# Patient Record
Sex: Female | Born: 1975
Health system: Southern US, Community
[De-identification: ages and names within clinical notes are randomized; demographics above are authoritative.]

---

## 2017-12-11 ENCOUNTER — Encounter: Payer: Self-pay | Admitting: Internal Medicine

## 2018-02-10 DIAGNOSIS — M6281 Muscle weakness (generalized): Secondary | ICD-10-CM | POA: Diagnosis not present

## 2018-02-10 DIAGNOSIS — M545 Low back pain: Secondary | ICD-10-CM | POA: Diagnosis not present

## 2018-02-12 DIAGNOSIS — M6281 Muscle weakness (generalized): Secondary | ICD-10-CM | POA: Diagnosis not present

## 2018-02-12 DIAGNOSIS — M545 Low back pain: Secondary | ICD-10-CM | POA: Diagnosis not present

## 2018-02-17 DIAGNOSIS — M545 Low back pain: Secondary | ICD-10-CM | POA: Diagnosis not present

## 2018-02-17 DIAGNOSIS — M6281 Muscle weakness (generalized): Secondary | ICD-10-CM | POA: Diagnosis not present

## 2018-02-25 DIAGNOSIS — M545 Low back pain: Secondary | ICD-10-CM | POA: Diagnosis not present

## 2018-02-25 DIAGNOSIS — M6281 Muscle weakness (generalized): Secondary | ICD-10-CM | POA: Diagnosis not present

## 2018-02-27 DIAGNOSIS — M545 Low back pain: Secondary | ICD-10-CM | POA: Diagnosis not present

## 2018-02-27 DIAGNOSIS — M6281 Muscle weakness (generalized): Secondary | ICD-10-CM | POA: Diagnosis not present

## 2018-09-25 DIAGNOSIS — E559 Vitamin D deficiency, unspecified: Secondary | ICD-10-CM | POA: Diagnosis not present

## 2018-09-25 DIAGNOSIS — D509 Iron deficiency anemia, unspecified: Secondary | ICD-10-CM | POA: Diagnosis not present

## 2018-09-25 DIAGNOSIS — M545 Low back pain: Secondary | ICD-10-CM | POA: Diagnosis not present

## 2018-09-25 DIAGNOSIS — E069 Thyroiditis, unspecified: Secondary | ICD-10-CM | POA: Diagnosis not present

## 2018-11-20 ENCOUNTER — Other Ambulatory Visit (HOSPITAL_COMMUNITY)
Admission: RE | Admit: 2018-11-20 | Discharge: 2018-11-20 | Disposition: A | Discharge status: Home / Self Care | Payer: BC Managed Care – PPO | Source: Ambulatory Visit | Attending: Family Medicine | Admitting: Family Medicine

## 2018-11-20 ENCOUNTER — Other Ambulatory Visit: Payer: Self-pay | Admitting: Family Medicine

## 2018-11-20 DIAGNOSIS — D509 Iron deficiency anemia, unspecified: Secondary | ICD-10-CM | POA: Diagnosis not present

## 2018-11-20 DIAGNOSIS — Z13220 Encounter for screening for lipoid disorders: Secondary | ICD-10-CM | POA: Diagnosis not present

## 2018-11-20 DIAGNOSIS — Z124 Encounter for screening for malignant neoplasm of cervix: Secondary | ICD-10-CM | POA: Insufficient documentation

## 2018-11-20 DIAGNOSIS — Z0000 Encounter for general adult medical examination without abnormal findings: Secondary | ICD-10-CM | POA: Diagnosis not present

## 2018-11-23 DIAGNOSIS — Z1231 Encounter for screening mammogram for malignant neoplasm of breast: Secondary | ICD-10-CM | POA: Diagnosis not present

## 2018-11-26 LAB — CYTOLOGY - PAP
Adequacy: ABSENT
Comment: NEGATIVE
Diagnosis: UNDETERMINED — AB
High risk HPV: POSITIVE — AB

## 2018-12-22 DIAGNOSIS — R87619 Unspecified abnormal cytological findings in specimens from cervix uteri: Secondary | ICD-10-CM | POA: Diagnosis not present

## 2018-12-22 DIAGNOSIS — Z3202 Encounter for pregnancy test, result negative: Secondary | ICD-10-CM | POA: Diagnosis not present

## 2019-01-10 DIAGNOSIS — Z20828 Contact with and (suspected) exposure to other viral communicable diseases: Secondary | ICD-10-CM | POA: Diagnosis not present

## 2019-01-21 DIAGNOSIS — M25551 Pain in right hip: Secondary | ICD-10-CM | POA: Diagnosis not present

## 2019-01-21 DIAGNOSIS — M62838 Other muscle spasm: Secondary | ICD-10-CM | POA: Diagnosis not present

## 2019-02-11 DIAGNOSIS — S7001XD Contusion of right hip, subsequent encounter: Secondary | ICD-10-CM | POA: Diagnosis not present

## 2019-02-11 DIAGNOSIS — G44319 Acute post-traumatic headache, not intractable: Secondary | ICD-10-CM | POA: Diagnosis not present

## 2019-02-11 DIAGNOSIS — S161XXD Strain of muscle, fascia and tendon at neck level, subsequent encounter: Secondary | ICD-10-CM | POA: Diagnosis not present

## 2019-02-11 DIAGNOSIS — S7011XD Contusion of right thigh, subsequent encounter: Secondary | ICD-10-CM | POA: Diagnosis not present

## 2019-02-12 ENCOUNTER — Other Ambulatory Visit: Payer: Self-pay | Admitting: Family Medicine

## 2019-02-12 ENCOUNTER — Ambulatory Visit
Admission: RE | Admit: 2019-02-12 | Discharge: 2019-02-12 | Disposition: A | Discharge status: Home / Self Care | Payer: BC Managed Care – PPO | Source: Ambulatory Visit | Attending: Family Medicine | Admitting: Family Medicine

## 2019-02-12 ENCOUNTER — Other Ambulatory Visit: Payer: Self-pay

## 2019-02-12 DIAGNOSIS — S7011XD Contusion of right thigh, subsequent encounter: Secondary | ICD-10-CM

## 2019-02-12 DIAGNOSIS — M25551 Pain in right hip: Secondary | ICD-10-CM | POA: Diagnosis not present

## 2019-02-12 DIAGNOSIS — S79911A Unspecified injury of right hip, initial encounter: Secondary | ICD-10-CM | POA: Diagnosis not present

## 2019-02-12 DIAGNOSIS — S7001XD Contusion of right hip, subsequent encounter: Secondary | ICD-10-CM

## 2019-02-12 DIAGNOSIS — S79921A Unspecified injury of right thigh, initial encounter: Secondary | ICD-10-CM | POA: Diagnosis not present

## 2019-02-25 DIAGNOSIS — Z20828 Contact with and (suspected) exposure to other viral communicable diseases: Secondary | ICD-10-CM | POA: Diagnosis not present

## 2019-04-03 DIAGNOSIS — Z20828 Contact with and (suspected) exposure to other viral communicable diseases: Secondary | ICD-10-CM | POA: Diagnosis not present

## 2019-05-21 DIAGNOSIS — N760 Acute vaginitis: Secondary | ICD-10-CM | POA: Diagnosis not present

## 2019-05-21 DIAGNOSIS — M542 Cervicalgia: Secondary | ICD-10-CM | POA: Diagnosis not present

## 2019-05-21 DIAGNOSIS — M5441 Lumbago with sciatica, right side: Secondary | ICD-10-CM | POA: Diagnosis not present

## 2019-06-03 DIAGNOSIS — M545 Low back pain: Secondary | ICD-10-CM | POA: Diagnosis not present

## 2019-06-03 DIAGNOSIS — M542 Cervicalgia: Secondary | ICD-10-CM | POA: Diagnosis not present

## 2019-06-16 DIAGNOSIS — M545 Low back pain: Secondary | ICD-10-CM | POA: Diagnosis not present

## 2019-06-16 DIAGNOSIS — M542 Cervicalgia: Secondary | ICD-10-CM | POA: Diagnosis not present

## 2019-07-06 ENCOUNTER — Other Ambulatory Visit: Payer: Self-pay | Admitting: Physician Assistant

## 2019-07-06 ENCOUNTER — Other Ambulatory Visit: Payer: Self-pay

## 2019-07-06 ENCOUNTER — Ambulatory Visit
Admission: RE | Admit: 2019-07-06 | Discharge: 2019-07-06 | Disposition: A | Discharge status: Home / Self Care | Payer: BC Managed Care – PPO | Source: Ambulatory Visit | Attending: Physician Assistant | Admitting: Physician Assistant

## 2019-07-06 DIAGNOSIS — R059 Cough, unspecified: Secondary | ICD-10-CM

## 2019-07-06 DIAGNOSIS — R05 Cough: Secondary | ICD-10-CM | POA: Diagnosis not present

## 2019-08-27 DIAGNOSIS — R768 Other specified abnormal immunological findings in serum: Secondary | ICD-10-CM | POA: Diagnosis not present

## 2019-08-27 DIAGNOSIS — N750 Cyst of Bartholin's gland: Secondary | ICD-10-CM | POA: Diagnosis not present
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
## 2020-12-01 IMAGING — CR DG CHEST 2V
2 series · 2 of 2 positions shown · non-contrast
Comparison: 01/08/2018

CLINICAL DATA: Cough

EXAM:
CHEST - 2 VIEW

[w chest pa]
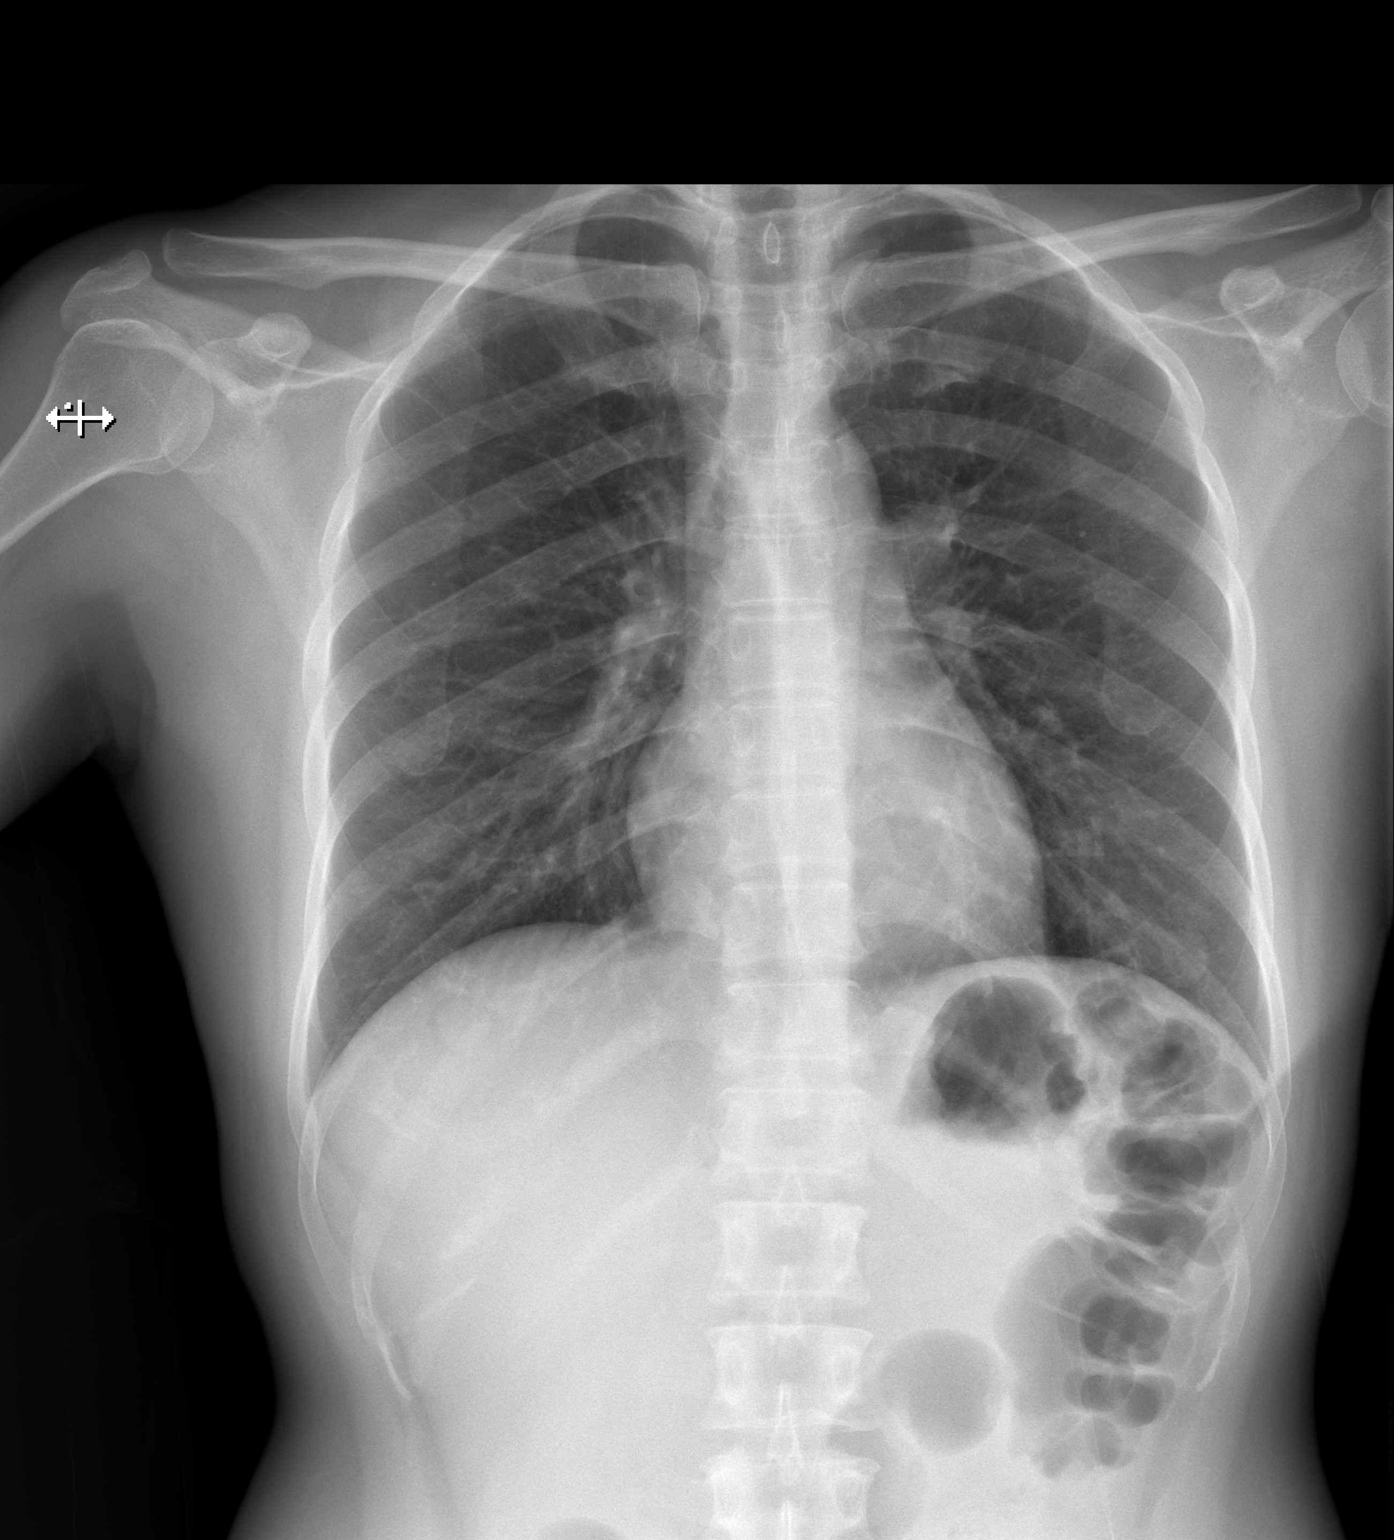

[w chest lat]
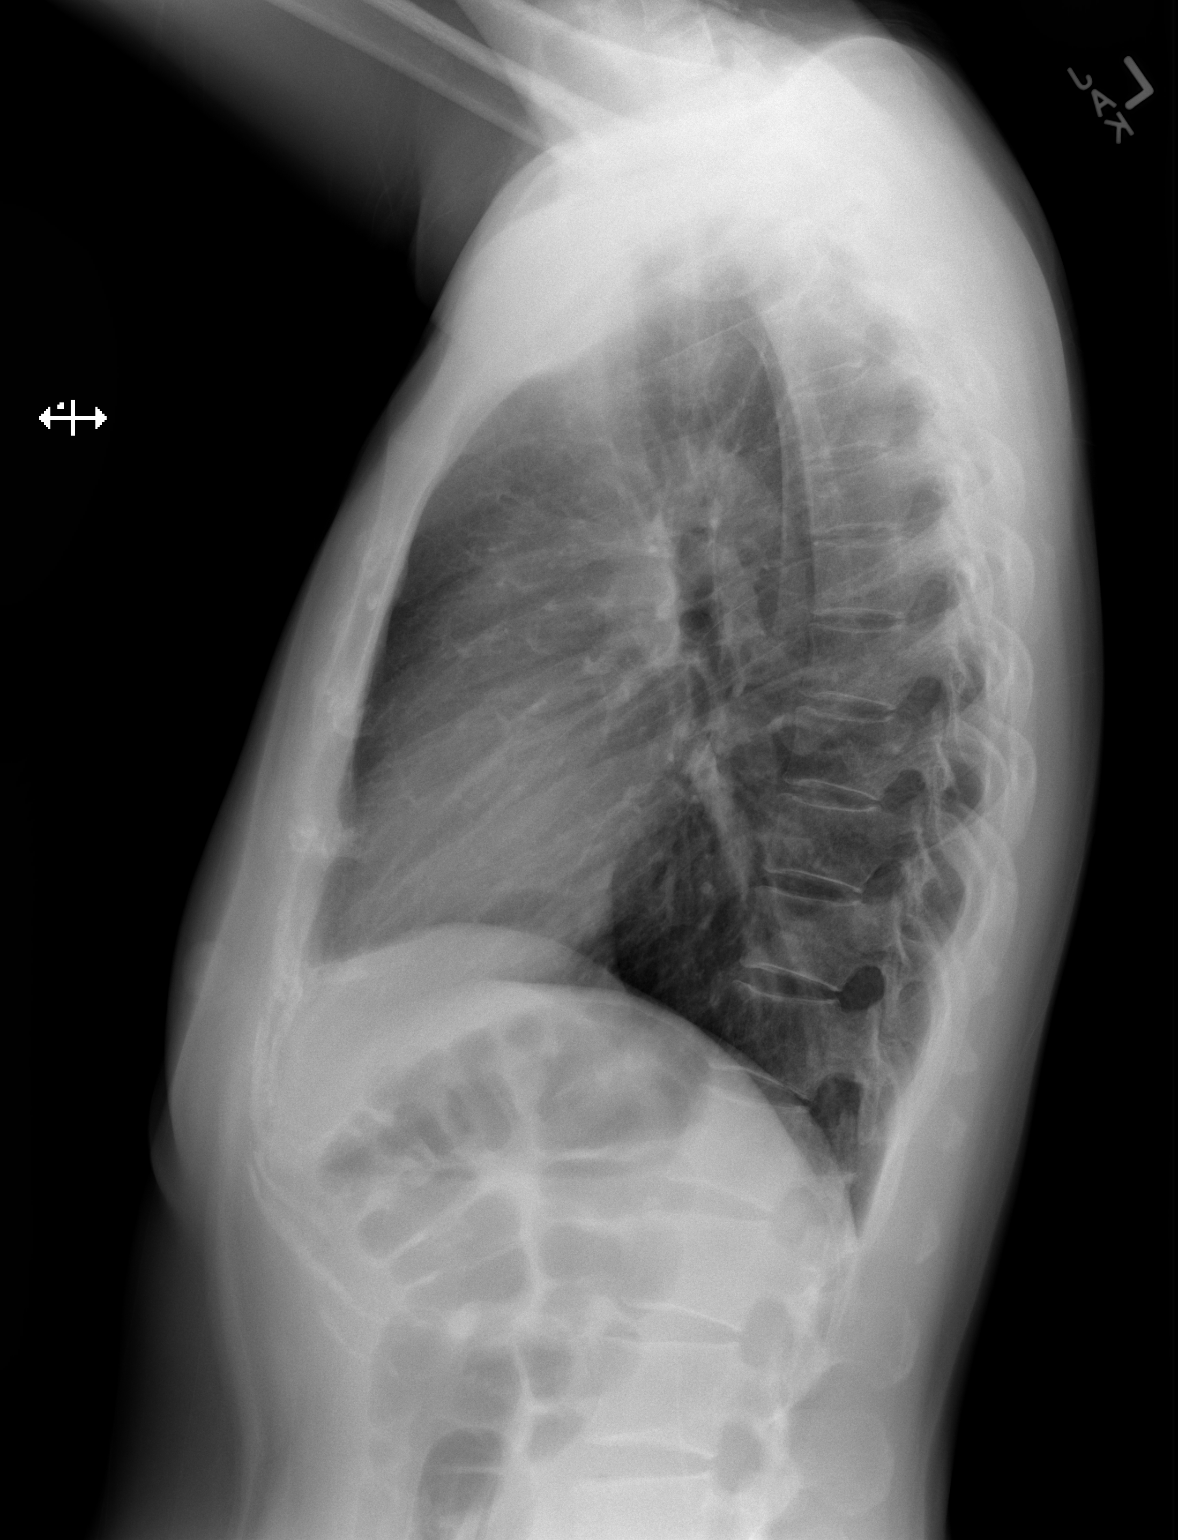

[2 of 2 positions shown; findings below may reference images not displayed]

FINDINGS: The heart size and mediastinal contours are within normal limits.
Both lungs are clear. The visualized skeletal structures are
unremarkable.
IMPRESSION: No acute abnormality of the lungs.

## 2021-08-20 ENCOUNTER — Emergency Department (HOSPITAL_COMMUNITY): Payer: No Typology Code available for payment source

## 2021-08-20 ENCOUNTER — Inpatient Hospital Stay (HOSPITAL_COMMUNITY)
Admission: EM | Admit: 2021-08-20 | Discharge: 2021-08-23 | DRG: 090 | Disposition: A | Discharge status: Home / Self Care | Payer: No Typology Code available for payment source | Attending: General Surgery | Admitting: General Surgery

## 2021-08-20 ENCOUNTER — Other Ambulatory Visit: Payer: Self-pay

## 2021-08-20 ENCOUNTER — Encounter (HOSPITAL_COMMUNITY): Payer: Self-pay | Admitting: General Surgery

## 2021-08-20 DIAGNOSIS — S069XAA Unspecified intracranial injury with loss of consciousness status unknown, initial encounter: Secondary | ICD-10-CM | POA: Diagnosis present

## 2021-08-20 DIAGNOSIS — S0990XA Unspecified injury of head, initial encounter: Principal | ICD-10-CM

## 2021-08-20 DIAGNOSIS — G9340 Encephalopathy, unspecified: Secondary | ICD-10-CM

## 2021-08-20 DIAGNOSIS — S069X0A Unspecified intracranial injury without loss of consciousness, initial encounter: Secondary | ICD-10-CM

## 2021-08-20 DIAGNOSIS — W208XXA Other cause of strike by thrown, projected or falling object, initial encounter: Secondary | ICD-10-CM | POA: Diagnosis present

## 2021-08-20 DIAGNOSIS — S060XAA Concussion with loss of consciousness status unknown, initial encounter: Secondary | ICD-10-CM | POA: Diagnosis present

## 2021-08-20 DIAGNOSIS — Y990 Civilian activity done for income or pay: Secondary | ICD-10-CM

## 2021-08-20 DIAGNOSIS — Z885 Allergy status to narcotic agent status: Secondary | ICD-10-CM

## 2021-08-20 DIAGNOSIS — R112 Nausea with vomiting, unspecified: Secondary | ICD-10-CM | POA: Diagnosis present

## 2021-08-20 DIAGNOSIS — R402423 Glasgow coma scale score 9-12, at hospital admission: Secondary | ICD-10-CM | POA: Diagnosis present

## 2021-08-20 DIAGNOSIS — Z20822 Contact with and (suspected) exposure to covid-19: Secondary | ICD-10-CM | POA: Diagnosis present

## 2021-08-20 DIAGNOSIS — R42 Dizziness and giddiness: Secondary | ICD-10-CM | POA: Diagnosis present

## 2021-08-20 LAB — COMPREHENSIVE METABOLIC PANEL
ALT: 14 U/L (ref 0–44)
AST: 22 U/L (ref 15–41)
Albumin: 3.9 g/dL (ref 3.5–5.0)
Alkaline Phosphatase: 54 U/L (ref 38–126)
Anion gap: 11 (ref 5–15)
BUN: 10 mg/dL (ref 6–20)
CO2: 20 mmol/L — ABNORMAL LOW (ref 22–32)
Calcium: 9 mg/dL (ref 8.9–10.3)
Chloride: 109 mmol/L (ref 98–111)
Creatinine, Ser: 0.76 mg/dL (ref 0.44–1.00)
GFR, Estimated: >60 mL/min (ref >60)
Glucose, Bld: 110 mg/dL — ABNORMAL HIGH (ref 70–99)
Potassium: 3.7 mmol/L (ref 3.5–5.1)
Sodium: 140 mmol/L (ref 135–145)
Total Bilirubin: 0.4 mg/dL (ref 0.3–1.2)
Total Protein: 6.4 g/dL — ABNORMAL LOW (ref 6.5–8.1)

## 2021-08-20 LAB — RESP PANEL BY RT-PCR (FLU A&B, COVID) ARPGX2
Influenza A by PCR: NEGATIVE
Influenza B by PCR: NEGATIVE
SARS Coronavirus 2 by RT PCR: NEGATIVE

## 2021-08-20 LAB — CBG MONITORING, ED: Glucose-Capillary: 113 mg/dL — ABNORMAL HIGH (ref 70–99)

## 2021-08-20 LAB — I-STAT CHEM 8, ED
BUN: 10 mg/dL (ref 6–20)
Calcium, Ion: 1 mmol/L — ABNORMAL LOW (ref 1.15–1.40)
Chloride: 107 mmol/L (ref 98–111)
Creatinine, Ser: 0.6 mg/dL (ref 0.44–1.00)
Glucose, Bld: 111 mg/dL — ABNORMAL HIGH (ref 70–99)
HCT: 40 % (ref 36.0–46.0)
Hemoglobin: 13.6 g/dL (ref 12.0–15.0)
Potassium: 3.4 mmol/L — ABNORMAL LOW (ref 3.5–5.1)
Sodium: 139 mmol/L (ref 135–145)
TCO2: 19 mmol/L — ABNORMAL LOW (ref 22–32)

## 2021-08-20 LAB — CBC
HCT: 42.7 % (ref 36.0–46.0)
Hemoglobin: 13.7 g/dL (ref 12.0–15.0)
MCH: 30.3 pg (ref 26.0–34.0)
MCHC: 32.1 g/dL (ref 30.0–36.0)
MCV: 94.5 fL (ref 80.0–100.0)
Platelets: 311 10*3/uL (ref 150–400)
RBC: 4.52 MIL/uL (ref 3.87–5.11)
RDW: 12.3 % (ref 11.5–15.5)
WBC: 8.6 10*3/uL (ref 4.0–10.5)
nRBC: 0 % (ref 0.0–0.2)

## 2021-08-20 LAB — URINALYSIS, ROUTINE W REFLEX MICROSCOPIC
Bilirubin Urine: NEGATIVE
Glucose, UA: NEGATIVE mg/dL
Hgb urine dipstick: NEGATIVE
Ketones, ur: NEGATIVE mg/dL
Leukocytes,Ua: NEGATIVE
Nitrite: NEGATIVE
Protein, ur: NEGATIVE mg/dL
Specific Gravity, Urine: 1.005 (ref 1.005–1.030)
pH: 7 (ref 5.0–8.0)

## 2021-08-20 LAB — LACTIC ACID, PLASMA: Lactic Acid, Venous: 2.3 mmol/L (ref 0.5–1.9)

## 2021-08-20 LAB — ETHANOL: Alcohol, Ethyl (B): <10 mg/dL (ref <10)

## 2021-08-20 LAB — PROTIME-INR
INR: 0.9 (ref 0.8–1.2)
Prothrombin Time: 12.3 seconds (ref 11.4–15.2)

## 2021-08-20 LAB — SAMPLE TO BLOOD BANK

## 2021-08-20 MED ORDER — PANTOPRAZOLE SODIUM 40 MG PO TBEC
40.0000 mg | DELAYED_RELEASE_TABLET | Freq: Every day | ORAL | Status: DC
  Administered 2021-08-21 – 2021-08-23 (×3): 40 mg via ORAL
  Filled 2021-08-20 (×3): qty 1

## 2021-08-20 MED ORDER — ONDANSETRON 4 MG PO TBDP: 4.0000 mg | ORAL_TABLET | Freq: Four times a day (QID) | ORAL | Status: DC | PRN

## 2021-08-20 MED ORDER — ONDANSETRON HCL 4 MG/2ML IJ SOLN: 4.0000 mg | Freq: Once | INTRAMUSCULAR | Status: AC

## 2021-08-20 MED ORDER — PANTOPRAZOLE SODIUM 40 MG IV SOLR
40.0000 mg | Freq: Every day | INTRAVENOUS | Status: DC
  Administered 2021-08-20: 40 mg via INTRAVENOUS
  Filled 2021-08-20: qty 10

## 2021-08-20 MED ORDER — MORPHINE SULFATE (PF) 4 MG/ML IV SOLN: 4.0000 mg | INTRAVENOUS | Status: DC | PRN

## 2021-08-20 MED ORDER — ENOXAPARIN SODIUM 30 MG/0.3ML IJ SOSY
30.0000 mg | PREFILLED_SYRINGE | Freq: Two times a day (BID) | INTRAMUSCULAR | Status: DC
  Administered 2021-08-21: 30 mg via SUBCUTANEOUS
  Filled 2021-08-20 (×5): qty 0.3

## 2021-08-20 MED ORDER — MORPHINE SULFATE (PF) 4 MG/ML IV SOLN: 4.0000 mg | Freq: Once | INTRAVENOUS | Status: DC

## 2021-08-20 MED ORDER — MORPHINE SULFATE (PF) 2 MG/ML IV SOLN
2.0000 mg | Freq: Once | INTRAVENOUS | Status: AC
  Administered 2021-08-20: 2 mg via INTRAVENOUS
  Filled 2021-08-20: qty 1

## 2021-08-20 MED ORDER — ONDANSETRON HCL 4 MG/2ML IJ SOLN
INTRAMUSCULAR | Status: AC
  Administered 2021-08-20: 4 mg via INTRAVENOUS
  Filled 2021-08-20: qty 2

## 2021-08-20 MED ORDER — MORPHINE SULFATE (PF) 2 MG/ML IV SOLN
2.0000 mg | INTRAVENOUS | Status: DC | PRN
  Administered 2021-08-20: 2 mg via INTRAVENOUS
  Filled 2021-08-20: qty 1

## 2021-08-20 MED ORDER — ONDANSETRON HCL 4 MG/2ML IJ SOLN: 4.0000 mg | Freq: Four times a day (QID) | INTRAMUSCULAR | Status: DC | PRN

## 2021-08-20 MED ORDER — POTASSIUM CHLORIDE IN NACL 20-0.9 MEQ/L-% IV SOLN
INTRAVENOUS | Status: DC
Start: 2021-08-20 — End: 2021-08-22
  Filled 2021-08-20 (×5): qty 1000
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
## 2021-08-20 NOTE — ED Notes (Signed)
Spoke with husband, Philis Kendall  phone number: [phone]  he states he will be here in approximately 10 minutes.

## 2021-08-20 NOTE — ED Notes (Signed)
Trauma Response Nurse Documentation   Melissa Mccarthy is a 46 y.o. female arriving to [facility] ED via EMS  On No antithrombotic. Trauma was activated as a Level 1 by ED Charge RN based on the following trauma criteria GCS < 9. Trauma team at the bedside on patient arrival. Patient cleared for CT by Dr. Janee Morn. Patient to CT with team. GCS 11 on arrival. Was an 8 in the field.  History   History reviewed. No pertinent past medical history.   History reviewed. No pertinent surgical history.    Initial Focused Assessment (If applicable, or please see trauma documentation): - GCS 11 - Unable to speak, but opens eyes spontaneously - PERRLA 4's brisk - BUE purposeful - BLE - unable to move at this time but has sensation - No obvious abrasions or external injuries - C-collar in place - 20G to L AC  CT's Completed:   CT Head and CT C-Spine   Interventions:  - trauma labs - CXR - Pelvic XR - CT head and c-spine - logrolled pt - unremarkable - no stepoffs or obvious deformities - 2mg  morphine given - 4mg  zofran given - BL TPMs WNL - pulses intact all throughout - Gave pen and paper to write - called and updated husband who is now at bedside. - pt nods that she would NOT want a blood transfusion if she needed one.  Noted in chart.  Plan for disposition:  Admission to Progressive Care   Consults completed:  none at 1800.  Event Summary: Pt was at work when she sustained a head injury from a garage door.  Per her coworkers- pt just wasn't "acting right".  They placed her in a WC and called EMS.  Arrived to trauma A in c-collar and nonverbal; seemingly confused.  GCS 11 on arrival.  After CTs were done, pt gesturing appropriately and was able to write.  Her GCS is improving and although still slightly confused, she is able to write and nod appropriately.  Still unable to open her mouth to speak and unable to move her legs.  Trauma MD to admit to progressive unit for obs at this  time.  Bedside handoff with ED RN  Trauma Response RN  Please call TRN at [phone] for further assistance.
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
## 2021-08-20 NOTE — ED Notes (Signed)
Back from CT. Some verbal responses. Speaks [place] Interpreter brought to [facility]. #[id number] Nicole Cella
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
## 2021-08-20 NOTE — ED Notes (Signed)
Patient transported to CT. No changes. Remains awake, eyes open, NAD, calm, resps e/u, non-verbal at this time, no moaning, catatonic-like. Not following commands. Airway patent.

## 2021-08-20 NOTE — ED Notes (Signed)
Portable chest and pelvis in progress

## 2021-08-20 NOTE — ED Notes (Signed)
Used call bell, c/o HA, wants something for head pain. Husband arrives to [facility].

## 2021-08-20 NOTE — Progress Notes (Signed)
Orthopedic Tech Progress Note Patient Details:  Melissa Mccarthy 12-Sep-1975 [id number]  Level 1 trauma   Patient ID: Melissa Mccarthy, female   DOB: [birth year], 46 y.o.   MRN: [id number]  Melissa Mccarthy 08/20/2021, 4:59 PM
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
## 2021-08-20 NOTE — ED Notes (Signed)
Husband works at [employer]: Philis Kendall

## 2021-08-20 NOTE — ED Notes (Signed)
On initial assessment pt c/o lower abdominal pressure like pain with inability to urinate. Lower abdomen was firm. Bladder scan showed greater than 600 mL of urine. Pt was in and out cath with output of 1100 mL of urine. Pt denies hx of urinary retention.

## 2021-08-20 NOTE — ED Notes (Signed)
Gestures, wants to write, doesn't appear to want to talk, again gestures wants to write.

## 2021-08-21 LAB — CBC
HCT: 37.3 % (ref 36.0–46.0)
Hemoglobin: 12.4 g/dL (ref 12.0–15.0)
MCH: 30 pg (ref 26.0–34.0)
MCHC: 33.2 g/dL (ref 30.0–36.0)
MCV: 90.3 fL (ref 80.0–100.0)
Platelets: 309 10*3/uL (ref 150–400)
RBC: 4.13 MIL/uL (ref 3.87–5.11)
RDW: 12.4 % (ref 11.5–15.5)
WBC: 7.1 10*3/uL (ref 4.0–10.5)
nRBC: 0 % (ref 0.0–0.2)

## 2021-08-21 LAB — BASIC METABOLIC PANEL
Anion gap: 8 (ref 5–15)
BUN: 10 mg/dL (ref 6–20)
CO2: 23 mmol/L (ref 22–32)
Calcium: 8 mg/dL — ABNORMAL LOW (ref 8.9–10.3)
Chloride: 109 mmol/L (ref 98–111)
Creatinine, Ser: 0.64 mg/dL (ref 0.44–1.00)
GFR, Estimated: >60 mL/min (ref >60)
Glucose, Bld: 114 mg/dL — ABNORMAL HIGH (ref 70–99)
Potassium: 3.9 mmol/L (ref 3.5–5.1)
Sodium: 140 mmol/L (ref 135–145)

## 2021-08-21 LAB — GLUCOSE, CAPILLARY: Glucose-Capillary: 108 mg/dL — ABNORMAL HIGH (ref 70–99)

## 2021-08-21 LAB — HIV ANTIBODY (ROUTINE TESTING W REFLEX): HIV Screen 4th Generation wRfx: NONREACTIVE

## 2021-08-21 MED ORDER — LIDOCAINE 5 % EX PTCH
1.0000 | MEDICATED_PATCH | CUTANEOUS | Status: DC
  Administered 2021-08-21 – 2021-08-23 (×3): 1 via TRANSDERMAL
  Filled 2021-08-21 (×3): qty 1

## 2021-08-21 MED ORDER — TAMSULOSIN HCL 0.4 MG PO CAPS
0.4000 mg | ORAL_CAPSULE | Freq: Every day | ORAL | Status: DC
  Administered 2021-08-21 – 2021-08-23 (×3): 0.4 mg via ORAL
  Filled 2021-08-21 (×3): qty 1

## 2021-08-21 MED ORDER — PROCHLORPERAZINE MALEATE 5 MG PO TABS: 5.0000 mg | ORAL_TABLET | Freq: Four times a day (QID) | ORAL | Status: DC | PRN

## 2021-08-21 MED ORDER — KETOROLAC TROMETHAMINE 15 MG/ML IJ SOLN
15.0000 mg | Freq: Four times a day (QID) | INTRAMUSCULAR | Status: DC | PRN
  Administered 2021-08-21 – 2021-08-23 (×5): 15 mg via INTRAVENOUS
  Filled 2021-08-21 (×5): qty 1

## 2021-08-21 MED ORDER — METHOCARBAMOL 500 MG PO TABS
500.0000 mg | ORAL_TABLET | Freq: Four times a day (QID) | ORAL | Status: DC | PRN
  Administered 2021-08-22: 500 mg via ORAL
  Filled 2021-08-21: qty 1

## 2021-08-21 MED ORDER — OXYCODONE HCL 5 MG PO TABS
5.0000 mg | ORAL_TABLET | ORAL | Status: DC | PRN
  Administered 2021-08-22: 5 mg via ORAL
  Administered 2021-08-22: 10 mg via ORAL
  Filled 2021-08-21: qty 2
  Filled 2021-08-21: qty 1

## 2021-08-21 NOTE — Evaluation (Signed)
Physical Therapy Evaluation Patient Details Name: Melissa Mccarthy MRN: [id number] DOB: 1975-04-23 Today's Date: 08/21/2021  History of Present Illness  Pt is a 46 y/o female admitted 7/24 after garage door fell and hit her in the head at work. Thought to have TBI/concussion. No pertinent PMH.  Clinical Impression  Pt admitted secondary to problem above with deficits below. Required min A to sit at EOB X2 this session. Pt with increased dizziness and nausea so further mobility deferred. Pt keeping eyes closed, so unable to determine if pt with nystagmus, but would likely benefit from further vestibular testing if pt can tolerate next session. Will need to progress mobility further to determine most appropriate d/c recommendations. Will continue to follow acutely.        Recommendations for follow up therapy are one component of a multi-disciplinary discharge planning process, led by the attending physician.  Recommendations may be updated based on patient status, additional functional criteria and insurance authorization.  Follow Up Recommendations Other (comment) (TBD pending pt progression)      Assistance Recommended at Discharge Frequent or constant Supervision/Assistance  Patient can return home with the following  A lot of help with walking and/or transfers;A lot of help with bathing/dressing/bathroom;Assistance with cooking/housework;Help with stairs or ramp for entrance;Assist for transportation    Equipment Recommendations Other (comment) (TBD pending progression)  Recommendations for Other Services       Functional Status Assessment Patient has had a recent decline in their functional status and demonstrates the ability to make significant improvements in function in a reasonable and predictable amount of time.     Precautions / Restrictions Precautions Precautions: Fall Restrictions Weight Bearing Restrictions: No      Mobility  Bed Mobility Overal bed mobility: Needs  Assistance Bed Mobility: Sidelying to Sit, Sit to Sidelying   Sidelying to sit: Min assist     Sit to sidelying: Min assist General bed mobility comments: Performed sitting X2 and both times pt very dizzy. On second attempt, pt with increased nausea and dry heaving so further mobility deferred. Pt unable to keep eyes open, so not sure if pt had nystagmus.    Transfers                        Ambulation/Gait                  Stairs            Wheelchair Mobility    Modified Rankin (Stroke Patients Only)       Balance Overall balance assessment: Needs assistance Sitting-balance support: Bilateral upper extremity supported, Feet supported Sitting balance-Leahy Scale: Poor Sitting balance - Comments: Reliant on BUE support secondary to dizziness.                                     Pertinent Vitals/Pain      Home Living Family/patient expects to be discharged to:: Private residence Living Arrangements: Spouse/significant other;Children Available Help at Discharge: Family;Available 24 hours/day Type of Home: House Home Access: Stairs to enter Entrance Stairs-Rails: Lawyer of Steps: 2-3   Home Layout: One level Home Equipment: None      Prior Function Prior Level of Function : Independent/Modified Independent;Driving;Working/employed                     Hand Dominance  Extremity/Trunk Assessment   Upper Extremity Assessment Upper Extremity Assessment: Defer to OT evaluation    Lower Extremity Assessment Lower Extremity Assessment: Overall WFL for tasks assessed    Cervical / Trunk Assessment Cervical / Trunk Assessment: Normal  Communication   Communication: Other (comment) (soft spoken)  Cognition Arousal/Alertness:  (keeping eyes closed throughout) Behavior During Therapy: Flat affect Overall Cognitive Status: No family/caregiver present to determine baseline cognitive  functioning                                 General Comments: Somewhat slowed processing but A and O X4        General Comments General comments (skin integrity, edema, etc.): No family present    Exercises     Assessment/Plan    PT Assessment Patient needs continued PT services  PT Problem List Decreased activity tolerance;Decreased balance;Decreased mobility;Decreased coordination;Decreased safety awareness;Decreased cognition       PT Treatment Interventions DME instruction;Gait training;Functional mobility training;Therapeutic activities;Balance training;Therapeutic exercise;Stair training;Patient/family education    PT Goals (Current goals can be found in the Care Plan section)  Acute Rehab PT Goals Patient Stated Goal: to feel better PT Goal Formulation: With patient Time For Goal Achievement: 09/04/21 Potential to Achieve Goals: Good    Frequency Min 3X/week     Co-evaluation               AM-PAC PT "6 Clicks" Mobility  Outcome Measure Help needed turning from your back to your side while in a flat bed without using bedrails?: A Little Help needed moving from lying on your back to sitting on the side of a flat bed without using bedrails?: A Little Help needed moving to and from a bed to a chair (including a wheelchair)?: A Lot Help needed standing up from a chair using your arms (e.g., wheelchair or bedside chair)?: A Lot Help needed to walk in hospital room?: A Lot Help needed climbing 3-5 steps with a railing? : Total 6 Click Score: 13    End of Session   Activity Tolerance: Treatment limited secondary to medical complications (Comment) (dizziness) Patient left: in bed;with call bell/phone within reach;with bed alarm set Nurse Communication: Mobility status;Other (comment) (pt unable to void) PT Visit Diagnosis: Unsteadiness on feet (R26.81);Difficulty in walking, not elsewhere classified (R26.2);Dizziness and giddiness (R42)    Time:  7253-6644 PT Time Calculation (min) (ACUTE ONLY): 18 min   Charges:   PT Evaluation $PT Eval Moderate Complexity: 1 Mod          Farley Ly, PT, DPT  Acute Rehabilitation Services  Office: [phone]   Lehman Prom 08/21/2021, 10:08 AM

## 2021-08-21 NOTE — Evaluation (Signed)
Occupational Therapy Evaluation Patient Details Name: Melissa Mccarthy MRN: [id number] DOB: Oct 22, 1975 Today's Date: 08/21/2021   History of Present Illness Pt is a 46 y/o female admitted 7/24 after garage door fell and hit her in the head at work. Thought to have TBI/concussion. No pertinent PMH.   Clinical Impression   PT admitted with TBI with vestibular deficits noted. Pt currently with functional limitiations due to the deficits listed below (see OT problem list). Pt requiring in and out cath so attempted [facility] transfer without success. Pt noted to start period so pad and mesh panties provided. Pt unable to demonstrate gaze stabilization and benefits from one step commands with repetition. Recommend two person (A) for any transfer attempts as pt is impulsive and pulling on staff for support.  Pt will benefit from skilled OT to increase their independence and safety with adls and balance to allow discharge outpatient OT likely needed pending pt progress.       Recommendations for follow up therapy are one component of a multi-disciplinary discharge planning process, led by the attending physician.  Recommendations may be updated based on patient status, additional functional criteria and insurance authorization.   Follow Up Recommendations  Outpatient OT    Assistance Recommended at Discharge Set up Supervision/Assistance  Patient can return home with the following A little help with walking and/or transfers;A little help with bathing/dressing/bathroom;Assistance with cooking/housework;Assistance with feeding;Direct supervision/assist for medications management;Direct supervision/assist for financial management;Assist for transportation    Functional Status Assessment  Patient has had a recent decline in their functional status and demonstrates the ability to make significant improvements in function in a reasonable and predictable amount of time.  Equipment Recommendations  BSC/3in1;Other  (comment) (possible RW needs)    Recommendations for Other Services       Precautions / Restrictions Precautions Precautions: Fall Restrictions Weight Bearing Restrictions: No      Mobility Bed Mobility Overal bed mobility: Needs Assistance Bed Mobility: Sidelying to Sit, Sit to Sidelying   Sidelying to sit: Min assist     Sit to sidelying: Min assist General bed mobility comments: Pt unable to keep eyes open, so not sure if pt had nystagmus. Pt with gaze stabilization attempted. pt with slide to sit from the L side    Transfers Overall transfer level: Needs assistance                 General transfer comment: BSC t/f      Balance Overall balance assessment: Needs assistance Sitting-balance support: Bilateral upper extremity supported, Feet supported Sitting balance-Leahy Scale: Fair Sitting balance - Comments: Reliant on BUE support secondary to dizziness.                                   ADL either performed or assessed with clinical judgement   ADL Overall ADL's : Needs assistance/impaired                         Toilet Transfer: Moderate assistance;BSC/3in1;Stand-pivot Toilet Transfer Details (indicate cue type and reason): needed max cues to not pull on therapist or therapist neck Toileting- Clothing Manipulation and Hygiene: Moderate assistance Toileting - Clothing Manipulation Details (indicate cue type and reason): provided pad and mesh panties due to period       General ADL Comments: progressed from bed to [facility] no void of any kind and back to bed  Vision   Additional Comments: pt keeping eyes closed. pt does not follow commands to sustain gaze stabilization. pt with one step command following with repetitive command repeat.     Perception     Praxis      Pertinent Vitals/Pain Pain Assessment Pain Assessment: Faces Faces Pain Scale: Hurts little more Pain Location: ha Pain Descriptors / Indicators:  Headache Pain Intervention(s): Limited activity within patient's tolerance, Monitored during session, Premedicated before session, Repositioned     Hand Dominance Right   Extremity/Trunk Assessment Upper Extremity Assessment Upper Extremity Assessment: Overall WFL for tasks assessed   Lower Extremity Assessment Lower Extremity Assessment: Defer to PT evaluation   Cervical / Trunk Assessment Cervical / Trunk Assessment: Normal   Communication Communication Communication: Other (comment) (soft spoken)   Cognition Arousal/Alertness: Awake/alert (keeping eyes closed throughout) Behavior During Therapy: Flat affect Overall Cognitive Status: Impaired/Different from baseline                                 General Comments: Somewhat slowed processing but A and O X4, pt required increased time of > 30 seconds to name home address, same to name youngest child. pt was unable to recall spouse truck color until given color options ( orange so its very distintive color per spouse)     General Comments  spouse present throughout    Exercises     Shoulder Instructions      Home Living Family/patient expects to be discharged to:: Private residence Living Arrangements: Spouse/significant other;Children Available Help at Discharge: Family;Available 24 hours/day Type of Home: House Home Access: Stairs to enter [employer] of Steps: 2-3 Entrance Stairs-Rails: Left;Right Home Layout: One level     Bathroom Shower/Tub: Chief Strategy Officer: Standard     Home Equipment: None   Additional Comments: spouse and kids      Prior Functioning/Environment Prior Level of Function : Independent/Modified Independent;Driving;Working/employed                        OT Problem List: Decreased activity tolerance;Impaired balance (sitting and/or standing);Impaired vision/perception;Decreased coordination;Decreased cognition;Decreased safety  awareness;Decreased knowledge of use of DME or AE;Decreased knowledge of precautions      OT Treatment/Interventions: Self-care/ADL training;Therapeutic exercise;Neuromuscular education;DME and/or AE instruction;Energy conservation;Manual therapy;Modalities;Therapeutic activities;Cognitive remediation/compensation;Visual/perceptual remediation/compensation;Patient/family education;Balance training    OT Goals(Current goals can be found in the care plan section) Acute Rehab OT Goals Patient Stated Goal: none stated by patient OT Goal Formulation: With patient/family Time For Goal Achievement: 09/04/21 Potential to Achieve Goals: Good  OT Frequency: Min 3X/week    Co-evaluation              AM-PAC OT "6 Clicks" Daily Activity     Outcome Measure Help from another person eating meals?: A Lot Help from another person taking care of personal grooming?: A Lot Help from another person toileting, which includes using toliet, bedpan, or urinal?: A Lot Help from another person bathing (including washing, rinsing, drying)?: A Lot Help from another person to put on and taking off regular upper body clothing?: A Lot Help from another person to put on and taking off regular lower body clothing?: A Lot 6 Click Score: 12   End of Session Equipment Utilized During Treatment: Gait belt Nurse Communication: Mobility status;Precautions  Activity Tolerance: Patient tolerated treatment well Patient left: in bed;with call bell/phone within reach;with bed alarm set;with family/visitor present  OT Visit Diagnosis: Unsteadiness on feet (R26.81)                Time: 1410-1430 OT Time Calculation (min): 20 min Charges:  OT General Charges $OT Visit: 1 Visit OT Evaluation $OT Eval Moderate Complexity: 1 Mod   Brynn, OTR/L  Acute Rehabilitation Services Office: [phone] .   Mateo Flow 08/21/2021, 2:40 PM

## 2021-08-21 NOTE — ED Notes (Signed)
Pt continues to retain urine as when first arriving to this shift. Bladder scan ultrasound showing 585 mL. Pt currently on purwick. Pt encouraged to urinate. Pt states she will try.

## 2021-08-21 NOTE — TOC Initial Note (Signed)
Transition of Care [facility]) - Initial/Assessment Note    Patient Details  Name: Melissa Mccarthy MRN: [id number] Date of Birth: [birth year]  Transition of Care [facility]) CM/SW Contact:    Glennon Mac, RN Phone Number: 08/21/2021, 3:56 PM  Clinical Narrative:                 Pt is a 46 y/o female admitted 7/24 after garage door fell and hit her in the head at work. Thought to have TBI/concussion. Prior to admission, patient independent and living at home with husband and children.  Her husband is at bedside; he verifies that case is Worker's Comp., and gives permission for Trauma Case Manager to speak with WC adjustor on her behalf.  Patient sleepy; anticipate continued progress with therapies and need for outpatient follow-up.  Will continue to follow as patient progresses.  Spoke with Scientist, water quality, [place] Mitchell at [employer]; phone: [phone].  He states that no adjustor has been assigned to patient's case at this time, but will call me back with adjustor information when available.  Expected Discharge Plan: OP Rehab Barriers to Discharge: Continued Medical Work up          Expected Discharge Plan and Services Expected Discharge Plan: OP Rehab   Discharge Planning Services: CM Consult   Living arrangements for the past 2 months: Single Family Home                                      Prior Living Arrangements/Services Living arrangements for the past 2 months: Single Family Home Lives with:: Spouse Patient language and need for interpreter reviewed:: Yes Do you feel safe going back to the place where you live?: Yes      Need for Family Participation in Patient Care: Yes (Comment) Care giver support system in place?: Yes (comment)   Criminal Activity/Legal Involvement Pertinent to Current Situation/Hospitalization: No - Comment as needed  Activities of Daily Living      Permission Sought/Granted   Permission granted to share information with : Yes,  Verbal Permission Granted  Share Information with NAME: [place] Mitchell  Permission granted to share info w AGENCY: [employer]  Permission granted to share info w Relationship: Scientist, water quality  Permission granted to share info w Contact Information: [phone]  Emotional Assessment Appearance:: Appears stated age Attitude/Demeanor/Rapport: Lethargic Affect (typically observed): Appropriate Orientation: : Oriented to Self, Oriented to Place, Oriented to  Time, Oriented to Situation      Admission diagnosis:  TBI (traumatic brain injury) (HCC) [S06.9XAA] Acute encephalopathy [G93.40] Injury of head, initial encounter [S09.90XA] Patient Active Problem List   Diagnosis Date Noted   TBI (traumatic brain injury) (HCC) 08/20/2021   PCP:  Aviva Kluver Pharmacy:   [facility] [id number] [facility] [place] - [address] [facility] [place] [address] Phone: [phone] Fax: [phone]     Social Determinants of Health (SDOH) Interventions    Readmission Risk Interventions     No data to display         Quintella Baton, RN, BSN  Trauma/Neuro ICU Case Manager [phone]

## 2021-08-21 NOTE — Evaluation (Signed)
Speech Language Pathology Evaluation Patient Details Name: Melissa Mccarthy MRN: [id number] DOB: 05/30/1975 Today's Date: 08/21/2021 Time: 1062-6948 SLP Time Calculation (min) (ACUTE ONLY): 9 min  Problem List:  Patient Active Problem List   Diagnosis Date Noted   TBI (traumatic brain injury) (HCC) 08/20/2021   Past Medical History: History reviewed. No pertinent past medical history. Past Surgical History: History reviewed. No pertinent surgical history. HPI:  Pt is a 46 y/o female admitted 7/24 after garage door fell and hit her in the head at work. Thought to have TBI/concussion. No pertinent PMH.   Assessment / Plan / Recommendation Clinical Impression  Pt participated in limited initial cognitive-linguistic assessment - she was very dizzy, lying flat in bed and kept eyes closed, stating she did not want to open them.  She understood and communicated in English quite well. She followed commands, selectively attended to verbal stimuli, was oriented to time/place/surroundings. Response time - verbal and motor- was delayed by as great as 15 seconds. Speech output was clear and fluent. Assessment was discontinued due to Melissa Mccarthy's obvious discomfort.  She will need further cognitive evaluation as her ability to participate improves.    SLP Assessment  SLP Recommendation/Assessment: Patient needs continued Speech Lanaguage Pathology Services SLP Visit Diagnosis: Cognitive communication deficit (R41.841)    Recommendations for follow up therapy are one component of a multi-disciplinary discharge planning process, led by the attending physician.  Recommendations may be updated based on patient status, additional functional criteria and insurance authorization.    Follow Up Recommendations   (tba)    Assistance Recommended at Discharge  Frequent or constant Supervision/Assistance  Functional Status Assessment Patient has had a recent decline in their functional status and demonstrates the  ability to make significant improvements in function in a reasonable and predictable amount of time.  Frequency and Duration min 2x/week  1 week      SLP Evaluation Cognition  Overall Cognitive Status: Within Functional Limits for tasks assessed Arousal/Alertness: Awake/alert Orientation Level: Oriented to person;Oriented to place;Oriented to situation Attention: Sustained Sustained Attention: Appears intact Memory:  (tba) Awareness: Appears intact       Comprehension  Auditory Comprehension Overall Auditory Comprehension: Appears within functional limits for tasks assessed Yes/No Questions: Within Functional Limits Commands: Within Functional Limits    Expression Expression Primary Mode of Expression: Verbal Verbal Expression Overall Verbal Expression: Appears within functional limits for tasks assessed   Oral / Motor  Oral Motor/Sensory Function Overall Oral Motor/Sensory Function: Within functional limits Motor Speech Overall Motor Speech: Appears within functional limits for tasks assessed           [name] L. Samson Frederic, MA CCC/SLP Clinical Specialist - Acute Care SLP Acute Rehabilitation Services Office number [phone]  [name] [name] 08/21/2021, 11:10 AM

## 2021-08-21 NOTE — Plan of Care (Signed)
  Problem: Education: Goal: Knowledge of General Education information will improve Description Including pain rating scale, medication(s)/side effects and non-pharmacologic comfort measures Outcome: Progressing   Problem: Health Behavior/Discharge Planning: Goal: Ability to manage health-related needs will improve Outcome: Progressing   

## 2021-08-21 NOTE — Progress Notes (Addendum)
[facility] Progress Note     Subjective: CC:  Eyes closed but appropriately answers questions. Cc headache, R sided neck pain, and inability to void. Her husband Is at the bedside.  Objective: Vital signs in last 24 hours: Temp:  [97.2 F (36.2 C)-97.3 F (36.3 C)] 97.3 F (36.3 C) (07/25 0611) Pulse Rate:  [66-91] 68 (07/25 0700) Resp:  [8-21] 15 (07/25 0700) BP: (90-123)/(47-78) 92/50 (07/25 0700) SpO2:  [97 %-100 %] 98 % (07/25 0700) Weight:  [52.2 kg] 52.2 kg (07/24 1731)    Intake/Output from previous day: 07/24 0701 - 07/25 0700 In: -  Out: 1100 [Urine:1100] Intake/Output this shift: No intake/output data recorded.  PE: Gen:  resting comfortably, NAD, pleasant HENT: normocephalic, atraumatic, no appreciable scalp hematoma or laceration, PERRL, anicteric sclerae Neck: collar removed - there is no midline tenderness, there is TTP over the R trapezius muscle, pt able to perform neck rotation and flexion/extension- slightly limited by headache and R trapezius pain.  Card:  Regular rate and rhythm, pedal pulses 2+ BL Pulm:  Normal effort, clear to auscultation bilaterally Abd: Soft, non-tender, non-distended, bowel sounds present in all 4 quadrants, no HSM Skin: warm and dry, no rashes  Psych: A&Ox4 - person, hospital, 2023, and hit by garage door at her company  Neuro: non-focal exam, moving all extremities   Lab Results:  Recent Labs    08/20/21 1706 08/20/21 1718 08/21/21 0341  WBC 8.6  --  7.1  HGB 13.7 13.6 12.4  HCT 42.7 40.0 37.3  PLT 311  --  309   BMET Recent Labs    08/20/21 1706 08/20/21 1718 08/21/21 0341  NA 140 139 140  K 3.7 3.4* 3.9  CL 109 107 109  CO2 20*  --  23  GLUCOSE 110* 111* 114*  BUN 10 10 10   CREATININE 0.76 0.60 0.64  CALCIUM 9.0  --  8.0*   PT/INR Recent Labs    08/20/21 1706  LABPROT 12.3  INR 0.9   CMP     Component Value Date/Time   NA 140 08/21/2021 0341   K 3.9 08/21/2021 0341   CL 109  08/21/2021 0341   CO2 23 08/21/2021 0341   GLUCOSE 114 (H) 08/21/2021 0341   BUN 10 08/21/2021 0341   CREATININE 0.64 08/21/2021 0341   CALCIUM 8.0 (L) 08/21/2021 0341   PROT 6.4 (L) 08/20/2021 1706   ALBUMIN 3.9 08/20/2021 1706   AST 22 08/20/2021 1706   ALT 14 08/20/2021 1706   ALKPHOS 54 08/20/2021 1706   BILITOT 0.4 08/20/2021 1706   GFRNONAA >60 08/21/2021 0341   Lipase  No results found for: "LIPASE"     Studies/Results: CT HEAD WO CONTRAST  Result Date: 08/20/2021 CLINICAL DATA:  Hit in head by garage door.  Unresponsive. EXAM: CT HEAD WITHOUT CONTRAST CT CERVICAL SPINE WITHOUT CONTRAST TECHNIQUE: Multidetector CT imaging of the head and cervical spine was performed following the standard protocol without intravenous contrast. Multiplanar CT image reconstructions of the cervical spine were also generated. RADIATION DOSE REDUCTION: This exam was performed according to the departmental dose-optimization program which includes automated exposure control, adjustment of the mA and/or kV according to patient size and/or use of iterative reconstruction technique. COMPARISON:  Cervical spine plain films of 01/22/2018. FINDINGS: CT HEAD FINDINGS Brain: Suspect scattered mild cerebral atrophy for age. Physiologic calcifications within the basal ganglia. No mass lesion, hemorrhage, hydrocephalus, acute infarct, intra-axial, or extra-axial fluid collection. Vascular: No hyperdense vessel or unexpected calcification.  Skull: No significant soft tissue swelling.  No skull fracture. Sinuses/Orbits: Normal imaged portions of the orbits and globes. Hypoplastic frontal sinuses. Other paranasal sinuses and paranasal sinuses are clear. Other: None. CT CERVICAL SPINE FINDINGS Alignment: Spinal visualization through the bottom of T2. Maintenance of vertebral body height and alignment. Skull base and vertebrae: Skull base intact. Coronal reformats demonstrate a normal C1-C2 articulation. Facets are  well-aligned. Soft tissues and spinal canal: Prevertebral soft tissues are within normal limits. Disc levels: Maintenance of intervertebral disc height. Mild left-sided neural foraminal narrowing at C5-6 secondary to facet hypertrophy. Upper chest: No apical pneumothorax. Other: None. IMPRESSION: 1.  No acute intracranial abnormality. 2. No acute fracture or subluxation within the cervical spine. Case discussed with Dr. Janee Morn at 5:18 p.m. Electronically Signed   By: Jeronimo Greaves M.D.   On: 08/20/2021 17:30   CT CERVICAL SPINE WO CONTRAST  Result Date: 08/20/2021 CLINICAL DATA:  Hit in head by garage door.  Unresponsive. EXAM: CT HEAD WITHOUT CONTRAST CT CERVICAL SPINE WITHOUT CONTRAST TECHNIQUE: Multidetector CT imaging of the head and cervical spine was performed following the standard protocol without intravenous contrast. Multiplanar CT image reconstructions of the cervical spine were also generated. RADIATION DOSE REDUCTION: This exam was performed according to the departmental dose-optimization program which includes automated exposure control, adjustment of the mA and/or kV according to patient size and/or use of iterative reconstruction technique. COMPARISON:  Cervical spine plain films of 01/22/2018. FINDINGS: CT HEAD FINDINGS Brain: Suspect scattered mild cerebral atrophy for age. Physiologic calcifications within the basal ganglia. No mass lesion, hemorrhage, hydrocephalus, acute infarct, intra-axial, or extra-axial fluid collection. Vascular: No hyperdense vessel or unexpected calcification. Skull: No significant soft tissue swelling.  No skull fracture. Sinuses/Orbits: Normal imaged portions of the orbits and globes. Hypoplastic frontal sinuses. Other paranasal sinuses and paranasal sinuses are clear. Other: None. CT CERVICAL SPINE FINDINGS Alignment: Spinal visualization through the bottom of T2. Maintenance of vertebral body height and alignment. Skull base and vertebrae: Skull base intact.  Coronal reformats demonstrate a normal C1-C2 articulation. Facets are well-aligned. Soft tissues and spinal canal: Prevertebral soft tissues are within normal limits. Disc levels: Maintenance of intervertebral disc height. Mild left-sided neural foraminal narrowing at C5-6 secondary to facet hypertrophy. Upper chest: No apical pneumothorax. Other: None. IMPRESSION: 1.  No acute intracranial abnormality. 2. No acute fracture or subluxation within the cervical spine. Case discussed with Dr. Janee Morn at 5:18 p.m. Electronically Signed   By: Jeronimo Greaves M.D.   On: 08/20/2021 17:30   DG Chest Port 1 View  Result Date: 08/20/2021 CLINICAL DATA:  Trauma EXAM: PORTABLE CHEST 1 VIEW COMPARISON:  July 06, 2019 FINDINGS: The heart size and mediastinal contours are within normal limits. Low lung volume. There are some streaky opacities seen at the perihilar regions greater greater on the left. No consolidation, hemo or pneumothorax. The visualized skeletal structures are unremarkable. IMPRESSION: Perihilar streaky opacities greater on the left likely atelectasis. No hemo or pneumothorax. No pulmonary contusion. Electronically Signed   By: Marjo Bicker M.D.   On: 08/20/2021 17:22   DG Pelvis Portable  Result Date: 08/20/2021 CLINICAL DATA:  Fall EXAM: PORTABLE PELVIS 1-2 VIEWS COMPARISON:  None Available. FINDINGS: There is no evidence of pelvic fracture or diastasis. No pelvic bone lesions are seen. IMPRESSION: Negative. Electronically Signed   By: Jasmine Pang M.D.   On: 08/20/2021 17:18    Anti-infectives: Anti-infectives (From admission, onward)    None  Assessment/Plan Struck in head by garage door TBI/significant concussion - CT H neg, CT C spine negative, collar cleared  - has HA and pain over R trapezius muscle. Start IV toradol PRN, PO robaxin PRN, lidoderm patch. Has listed anaphylactic reaction to codeine/tylenol.  FEN: mental status improved - advance diet to regular ID: None  indicated VTE: SCD's, lovenox  Foley: none currently, has had I&O cath once, RN to bladder scan patient and perform I&O cath a second time if >350 mL. Start flomax. After that, if unable to void, place to place foley Dispo: med surg, advance diet, PT/OT/SLP cog eval    ADDENDUM 1345 - noted that PT/SLP limited by dizziness, nausea, discomfort. Added compazine. Appreciate therapies following acutely.   LOS: 1 day   I reviewed nursing notes, ED provider notes, last 24 h vitals and pain scores, last 48 h intake and output, last 24 h labs and trends, and last 24 h imaging results.   Hosie Spangle, PA-C [facility] Please see Amion for pager number during day hours 7:00am-4:30pm

## 2021-08-21 NOTE — ED Notes (Addendum)
Paged trauma admitting concerning pain management. BP soft throughout night. Last BP 99/52. Pt requesting pain medication. Unable to given morphine d/t low BP. Paged to request alternative pain medication and ongoing urinary retention. Awaiting call back.

## 2021-08-22 MED ORDER — PROCHLORPERAZINE MALEATE 5 MG PO TABS
5.0000 mg | ORAL_TABLET | Freq: Four times a day (QID) | ORAL | Status: DC
  Administered 2021-08-22 – 2021-08-23 (×2): 5 mg via ORAL
  Filled 2021-08-22 (×6): qty 1

## 2021-08-22 NOTE — Progress Notes (Signed)
[facility] Progress Note     Subjective: CC:  Eyes closed but appropriately answers questions. Cc headache, R sided neck pain, and inability to void. Her husband Is at the bedside.  Objective: Vital signs in last 24 hours: Temp:  [98.1 F (36.7 C)-98.7 F (37.1 C)] 98.1 F (36.7 C) (07/26 0756) Pulse Rate:  [66-79] 70 (07/26 0756) Resp:  [16-18] 18 (07/26 0756) BP: (92-101)/(55-60) 96/58 (07/26 0756) SpO2:  [98 %-100 %] 100 % (07/26 0756) Last BM Date : 08/20/21  Intake/Output from previous day: 07/25 0701 - 07/26 0700 In: 120 [P.O.:120] Out: 1000 [Urine:1000] Intake/Output this shift: Total I/O In: 240 [P.O.:240] Out: -   PE: Gen:  resting comfortably, NAD, pleasant HENT: normocephalic, atraumatic, no appreciable scalp hematoma or laceration, PERRL, anicteric sclerae Neck: collar removed - there is no midline tenderness, there is TTP over the R trapezius muscle, pt able to perform neck rotation and flexion/extension- slightly limited by headache and R trapezius pain.  Card:  Regular rate and rhythm, pedal pulses 2+ BL Pulm:  Normal effort, clear to auscultation bilaterally Abd: Soft, non-tender, non-distended, bowel sounds present in all 4 quadrants, no HSM Skin: warm and dry, no rashes  Psych: A&Ox4 - person, hospital, 2023, and hit by garage door at her company  Neuro: non-focal exam, moving all extremities   Lab Results:  Recent Labs    08/20/21 1706 08/20/21 1718 08/21/21 0341  WBC 8.6  --  7.1  HGB 13.7 13.6 12.4  HCT 42.7 40.0 37.3  PLT 311  --  309   BMET Recent Labs    08/20/21 1706 08/20/21 1718 08/21/21 0341  NA 140 139 140  K 3.7 3.4* 3.9  CL 109 107 109  CO2 20*  --  23  GLUCOSE 110* 111* 114*  BUN 10 10 10   CREATININE 0.76 0.60 0.64  CALCIUM 9.0  --  8.0*   PT/INR Recent Labs    08/20/21 1706  LABPROT 12.3  INR 0.9   CMP     Component Value Date/Time   NA 140 08/21/2021 0341   K 3.9 08/21/2021 0341   CL 109  08/21/2021 0341   CO2 23 08/21/2021 0341   GLUCOSE 114 (H) 08/21/2021 0341   BUN 10 08/21/2021 0341   CREATININE 0.64 08/21/2021 0341   CALCIUM 8.0 (L) 08/21/2021 0341   PROT 6.4 (L) 08/20/2021 1706   ALBUMIN 3.9 08/20/2021 1706   AST 22 08/20/2021 1706   ALT 14 08/20/2021 1706   ALKPHOS 54 08/20/2021 1706   BILITOT 0.4 08/20/2021 1706   GFRNONAA >60 08/21/2021 0341   Lipase  No results found for: "LIPASE"     Studies/Results: CT HEAD WO CONTRAST  Result Date: 08/20/2021 CLINICAL DATA:  Hit in head by garage door.  Unresponsive. EXAM: CT HEAD WITHOUT CONTRAST CT CERVICAL SPINE WITHOUT CONTRAST TECHNIQUE: Multidetector CT imaging of the head and cervical spine was performed following the standard protocol without intravenous contrast. Multiplanar CT image reconstructions of the cervical spine were also generated. RADIATION DOSE REDUCTION: This exam was performed according to the departmental dose-optimization program which includes automated exposure control, adjustment of the mA and/or kV according to patient size and/or use of iterative reconstruction technique. COMPARISON:  Cervical spine plain films of 01/22/2018. FINDINGS: CT HEAD FINDINGS Brain: Suspect scattered mild cerebral atrophy for age. Physiologic calcifications within the basal ganglia. No mass lesion, hemorrhage, hydrocephalus, acute infarct, intra-axial, or extra-axial fluid collection. Vascular: No hyperdense vessel or unexpected calcification. Skull:  No significant soft tissue swelling.  No skull fracture. Sinuses/Orbits: Normal imaged portions of the orbits and globes. Hypoplastic frontal sinuses. Other paranasal sinuses and paranasal sinuses are clear. Other: None. CT CERVICAL SPINE FINDINGS Alignment: Spinal visualization through the bottom of T2. Maintenance of vertebral body height and alignment. Skull base and vertebrae: Skull base intact. Coronal reformats demonstrate a normal C1-C2 articulation. Facets are  well-aligned. Soft tissues and spinal canal: Prevertebral soft tissues are within normal limits. Disc levels: Maintenance of intervertebral disc height. Mild left-sided neural foraminal narrowing at C5-6 secondary to facet hypertrophy. Upper chest: No apical pneumothorax. Other: None. IMPRESSION: 1.  No acute intracranial abnormality. 2. No acute fracture or subluxation within the cervical spine. Case discussed with Dr. Janee Morn at 5:18 p.m. Electronically Signed   By: Jeronimo Greaves M.D.   On: 08/20/2021 17:30   CT CERVICAL SPINE WO CONTRAST  Result Date: 08/20/2021 CLINICAL DATA:  Hit in head by garage door.  Unresponsive. EXAM: CT HEAD WITHOUT CONTRAST CT CERVICAL SPINE WITHOUT CONTRAST TECHNIQUE: Multidetector CT imaging of the head and cervical spine was performed following the standard protocol without intravenous contrast. Multiplanar CT image reconstructions of the cervical spine were also generated. RADIATION DOSE REDUCTION: This exam was performed according to the departmental dose-optimization program which includes automated exposure control, adjustment of the mA and/or kV according to patient size and/or use of iterative reconstruction technique. COMPARISON:  Cervical spine plain films of 01/22/2018. FINDINGS: CT HEAD FINDINGS Brain: Suspect scattered mild cerebral atrophy for age. Physiologic calcifications within the basal ganglia. No mass lesion, hemorrhage, hydrocephalus, acute infarct, intra-axial, or extra-axial fluid collection. Vascular: No hyperdense vessel or unexpected calcification. Skull: No significant soft tissue swelling.  No skull fracture. Sinuses/Orbits: Normal imaged portions of the orbits and globes. Hypoplastic frontal sinuses. Other paranasal sinuses and paranasal sinuses are clear. Other: None. CT CERVICAL SPINE FINDINGS Alignment: Spinal visualization through the bottom of T2. Maintenance of vertebral body height and alignment. Skull base and vertebrae: Skull base intact.  Coronal reformats demonstrate a normal C1-C2 articulation. Facets are well-aligned. Soft tissues and spinal canal: Prevertebral soft tissues are within normal limits. Disc levels: Maintenance of intervertebral disc height. Mild left-sided neural foraminal narrowing at C5-6 secondary to facet hypertrophy. Upper chest: No apical pneumothorax. Other: None. IMPRESSION: 1.  No acute intracranial abnormality. 2. No acute fracture or subluxation within the cervical spine. Case discussed with Dr. Janee Morn at 5:18 p.m. Electronically Signed   By: Jeronimo Greaves M.D.   On: 08/20/2021 17:30   DG Chest Port 1 View  Result Date: 08/20/2021 CLINICAL DATA:  Trauma EXAM: PORTABLE CHEST 1 VIEW COMPARISON:  July 06, 2019 FINDINGS: The heart size and mediastinal contours are within normal limits. Low lung volume. There are some streaky opacities seen at the perihilar regions greater greater on the left. No consolidation, hemo or pneumothorax. The visualized skeletal structures are unremarkable. IMPRESSION: Perihilar streaky opacities greater on the left likely atelectasis. No hemo or pneumothorax. No pulmonary contusion. Electronically Signed   By: Marjo Bicker M.D.   On: 08/20/2021 17:22   DG Pelvis Portable  Result Date: 08/20/2021 CLINICAL DATA:  Fall EXAM: PORTABLE PELVIS 1-2 VIEWS COMPARISON:  None Available. FINDINGS: There is no evidence of pelvic fracture or diastasis. No pelvic bone lesions are seen. IMPRESSION: Negative. Electronically Signed   By: Jasmine Pang M.D.   On: 08/20/2021 17:18    Anti-infectives: Anti-infectives (From admission, onward)    None        Assessment/Plan  Struck in head by garage door TBI/significant concussion - CT H neg, CT C spine negative, collar cleared  - post-concussive sxs include nausea, dizziness, fatigue - continue therapies, schedule compazine.   FEN: regular, eating and drinking well, saline lock IV. ID: None indicated VTE: SCD's, lovenox  Foley: none; required  I&Ox2 on admission Dispo: med surg,  PT/OT/SLP, antiemetics     LOS: 2 days   I reviewed nursing notes, ED provider notes, last 24 h vitals and pain scores, last 48 h intake and output, last 24 h labs and trends, and last 24 h imaging results.   Hosie Spangle, PA-C [facility] Please see Amion for pager number during day hours 7:00am-4:30pm

## 2021-08-22 NOTE — Progress Notes (Signed)
Physical Therapy Treatment Patient Details Name: Melissa Mccarthy MRN: [id number] DOB: 06-12-1975 Today's Date: 08/22/2021   History of Present Illness Pt is a 46 y/o female admitted 7/24 after garage door fell and hit her in the head at work. Thought to have TBI/concussion. No pertinent PMH.    PT Comments    Pt admitted with above diagnosis. Pt was able to ambulate with RW with mod to min assist with 2nd person for safety. Limited by dizziness and reported bil LE weakness and asked to sit down. Pt continues to make slow progress with therapy due to dizziness. Did attempt to teach pt compensation strategies as well as attempts to initiate x1 exercises however pt cannot tolerate the x1 exercises yet.   Pt currently with functional limitations due to balance and endurance deficits. Pt will benefit from skilled PT to increase their independence and safety with mobility to allow discharge to the venue listed below.      Recommendations for follow up therapy are one component of a multi-disciplinary discharge planning process, led by the attending physician.  Recommendations may be updated based on patient status, additional functional criteria and insurance authorization.  Follow Up Recommendations  Outpatient PT (vestibular rehab)     Assistance Recommended at Discharge Frequent or constant Supervision/Assistance  Patient can return home with the following A lot of help with walking and/or transfers;A lot of help with bathing/dressing/bathroom;Assistance with cooking/housework;Help with stairs or ramp for entrance;Assist for transportation   Equipment Recommendations  Rolling walker (2 wheels)    Recommendations for Other Services       Precautions / Restrictions Precautions Precautions: Fall Restrictions Weight Bearing Restrictions: No     Mobility  Bed Mobility               General bed mobility comments: Pt on EOB with OT in room on arrival.  Pt reports dizziness with R side  head turns or positioning pt with chin tuck to the R with rolling then supine to sit per OT. pt needs (A) to sustain neck in neutral and not hold it in extension while sitting EOB.    Transfers Overall transfer level: Needs assistance Equipment used: Rolling walker (2 wheels) Transfers: Sit to/from Stand Sit to Stand: Min guard           General transfer comment: cues for hand placement to reach for surfaces.  Also had pt focus on target "A" to help with dizziness. Pt tends to close eyes and needs cues to focus on surroundings to improve vertigo.    Ambulation/Gait Ambulation/Gait assistance: Min assist, Mod assist, +2 safety/equipment Gait Distance (Feet): 20 Feet Assistive device: Rolling walker (2 wheels) Gait Pattern/deviations: Decreased step length - right, Decreased step length - left, Shuffle   Gait velocity interpretation: <1.31 ft/sec, indicative of household ambulator   General Gait Details: Pt needs continued steadying assist for balance with ambulation. Pt able to ambulate with cues to focus on target "A" when dizzy. Pt with slow and guarded gait. When pt had walked about 20 feet, stated her "legs just wont go more" and pt would not progress ambulation and asked for chair.  Brought chair to pt and she sat down in chair to rest.  Took pt back to room.   Stairs             Wheelchair Mobility    Modified Rankin (Stroke Patients Only)       Balance Overall balance assessment: Needs assistance Sitting-balance support: Bilateral upper extremity  supported, Feet supported Sitting balance-Leahy Scale: Fair Sitting balance - Comments: Reliant on BUE support secondary to dizziness.   Standing balance support: Bilateral upper extremity supported, During functional activity, Reliant on assistive device for balance Standing balance-Leahy Scale: Poor Standing balance comment: relies on bil UE support                            Cognition  Arousal/Alertness: Awake/alert Behavior During Therapy: Flat affect Overall Cognitive Status: Impaired/Different from baseline                                 General Comments: pt reports "feel better today" pt answering questions more brisk than previous sessions. pt with decreased responses or speed with upright posture. pt unable to name hospital but able to report being in [facility]        Exercises      General Comments General comments (skin integrity, edema, etc.): spouse present initially at the start of session. pt continued to have flat affect and delays in responses to recall      Pertinent Vitals/Pain Pain Assessment Pain Assessment: Faces Faces Pain Scale: Hurts little more Pain Descriptors / Indicators: Headache Pain Intervention(s): Limited activity within patient's tolerance, Monitored during session, Repositioned    Home Living                          Prior Function            PT Goals (current goals can now be found in the care plan section) Acute Rehab PT Goals Patient Stated Goal: to feel better Progress towards PT goals: Progressing toward goals    Frequency    Min 3X/week      PT Plan Current plan remains appropriate    Co-evaluation              AM-PAC PT "6 Clicks" Mobility   Outcome Measure  Help needed turning from your back to your side while in a flat bed without using bedrails?: A Little Help needed moving from lying on your back to sitting on the side of a flat bed without using bedrails?: A Little Help needed moving to and from a bed to a chair (including a wheelchair)?: A Lot Help needed standing up from a chair using your arms (e.g., wheelchair or bedside chair)?: A Lot Help needed to walk in hospital room?: A Lot Help needed climbing 3-5 steps with a railing? : Total 6 Click Score: 13    End of Session Equipment Utilized During Treatment: Gait belt Activity Tolerance: Treatment limited  secondary to medical complications (Comment) (dizziness) Patient left: with call bell/phone within reach;in chair;with chair alarm set Nurse Communication: Mobility status PT Visit Diagnosis: Unsteadiness on feet (R26.81);Difficulty in walking, not elsewhere classified (R26.2);Dizziness and giddiness (R42)     Time: 7654-6503 PT Time Calculation (min) (ACUTE ONLY): 20 min  Charges:  $Gait Training: 8-22 mins                     Jahmiya Guidotti M,PT Acute Rehab Services [phone]    Bevelyn Buckles 08/22/2021, 2:15 PM

## 2021-08-22 NOTE — Progress Notes (Signed)
Occupational Therapy Treatment Patient Details Name: Melissa Mccarthy MRN: [id number] DOB: 06-28-1975 Today's Date: 08/22/2021   History of present illness Pt is a 46 y/o female admitted 7/24 after garage door fell and hit her in the head at work. Thought to have TBI/concussion. No pertinent PMH.   OT comments  Pt progressing out of bed today with (A) for gaze stabilization. Pt requesting to return to supine several times during eob sitting and encouraged to stay upright. Pt with a upward L beat to eyes with repositioning for vestibular assessment. Upward movement is consistent with the TBI concussion. Recommendation for outpatient OT follow at this time.    Recommendations for follow up therapy are one component of a multi-disciplinary discharge planning process, led by the attending physician.  Recommendations may be updated based on patient status, additional functional criteria and insurance authorization.    Follow Up Recommendations  Outpatient OT    Assistance Recommended at Discharge Set up Supervision/Assistance  Patient can return home with the following  A little help with walking and/or transfers;A little help with bathing/dressing/bathroom;Assistance with cooking/housework;Assistance with feeding;Direct supervision/assist for medications management;Direct supervision/assist for financial management;Assist for transportation   Equipment Recommendations  BSC/3in1;Other (comment)    Recommendations for Other Services      Precautions / Restrictions Precautions Precautions: Fall       Mobility Bed Mobility Overal bed mobility: Needs Assistance Bed Mobility: Supine to Sit   Sidelying to sit: Mod assist       General bed mobility comments: pt reports dizziness with R side head turns or positioning pt with chin tuck to the R with rolling then supine to sit. pt needs (A) to sustain neck in neutral and not hold it in extension    Transfers Overall transfer level: Needs  assistance Equipment used: Rolling walker (2 wheels) Transfers: Sit to/from Stand Sit to Stand: Min guard           General transfer comment: cues for hand placement to reach for surfaces     Balance Overall balance assessment: Needs assistance Sitting-balance support: Bilateral upper extremity supported, Feet supported Sitting balance-Leahy Scale: Fair     Standing balance support: Bilateral upper extremity supported, During functional activity, Reliant on assistive device for balance Standing balance-Leahy Scale: Poor                             ADL either performed or assessed with clinical judgement   ADL Overall ADL's : Needs assistance/impaired   Eating/Feeding Details (indicate cue type and reason): spouse ordering lunch via phone for patient on arrival. pt able to express the foods she would like to eat Grooming: Wash/dry face;Bed level;Minimal assistance Grooming Details (indicate cue type and reason): pt needed cues to complete it herself                 Toilet Transfer: Minimal assistance           Functional mobility during ADLs: Minimal assistance;Rolling walker (2 wheels) General ADL Comments: pt and tech separately report pt was able to use RW to the bathroom today. tech reports pt was reaching for staff initially. pt with strong weight bearing on RW and reports "my legs wont work"    Extremity/Trunk Assessment Upper Extremity Assessment Upper Extremity Assessment: Overall WFL for tasks assessed   Lower Extremity Assessment Lower Extremity Assessment: Defer to PT evaluation        Vision   Additional Comments: pt  with increased gaze stabilization this session but continued to require cues to sustain attention to task. pt cues to not occlude vision   Perception     Praxis      Cognition Arousal/Alertness: Awake/alert Behavior During Therapy: Flat affect Overall Cognitive Status: Impaired/Different from baseline                                  General Comments: pt reports "feel better today" pt answering questions supine on arrival more brisk than previous sessions. pt with decreased responses or speed with upright posture. pt unable to name hospital but able to report being in [facility]        Exercises      Shoulder Instructions       General Comments spouse present initially at the start of session. pt continued to have flat affect and delays in responses to recall    Pertinent Vitals/ Pain       Pain Assessment Pain Assessment: Faces Faces Pain Scale: Hurts little more Pain Descriptors / Indicators: Headache Pain Intervention(s): Monitored during session, Repositioned  Home Living                                          Prior Functioning/Environment              Frequency  Min 3X/week        Progress Toward Goals  OT Goals(current goals can now be found in the care plan section)  Progress towards OT goals: Progressing toward goals  Acute Rehab OT Goals Patient Stated Goal: none stated OT Goal Formulation: With patient/family Time For Goal Achievement: 09/04/21 Potential to Achieve Goals: Good ADL Goals Pt Will Transfer to Toilet: with min guard assist;bedside commode Additional ADL Goal #1: pt will follow 2 step command 50% of attempts Additional ADL Goal #2: pt will demonstrate gaze stabilization 50%  of session Additional ADL Goal #3: pt will complete bed mobility supervision level as precursor to adls.  Plan Discharge plan remains appropriate    Co-evaluation                 AM-PAC OT "6 Clicks" Daily Activity     Outcome Measure   Help from another person eating meals?: A Lot Help from another person taking care of personal grooming?: A Lot Help from another person toileting, which includes using toliet, bedpan, or urinal?: A Lot Help from another person bathing (including washing, rinsing, drying)?: A Lot Help from another  person to put on and taking off regular upper body clothing?: A Lot Help from another person to put on and taking off regular lower body clothing?: A Lot 6 Click Score: 12    End of Session Equipment Utilized During Treatment: Gait belt;Rolling walker (2 wheels)  OT Visit Diagnosis: Unsteadiness on feet (R26.81)   Activity Tolerance Patient tolerated treatment well   Patient Left Other (comment) (with PT Dawn)   Nurse Communication Mobility status;Precautions        Time: 1238 (1238)-1300 OT Time Calculation (min): 22 min  Charges: OT General Charges $OT Visit: 1 Visit OT Treatments $Self Care/Home Management : 8-22 mins   Brynn, OTR/L  Acute Rehabilitation Services Office: [phone] .   Mateo Flow 08/22/2021, 1:55 PM

## 2021-08-22 NOTE — Progress Notes (Signed)
SLP Cancellation Note  Patient Details Name: Denece Shearer MRN: [id number] DOB: 1975-05-30   Cancelled treatment:       Reason Eval/Treat Not Completed: Fatigue/lethargy limiting ability to participate (Pt reported that she is very sleepy after having therapy. It was agreed that SLP will follow up on a subsequent date.)  Esther Broyles I. Vear Clock, MS, CCC-SLP Acute Rehabilitation Services Office number [phone] Pager [phone]  Scheryl Marten 08/22/2021, 2:54 PM

## 2021-08-23 MED ORDER — IBUPROFEN 600 MG PO TABS: 600.0000 mg | ORAL_TABLET | Freq: Three times a day (TID) | ORAL | 0 refills | Status: AC | PRN

## 2021-08-23 MED ORDER — OXYCODONE HCL 5 MG PO TABS: 5.0000 mg | ORAL_TABLET | Freq: Three times a day (TID) | ORAL | 0 refills | Status: DC | PRN

## 2021-08-23 MED ORDER — METHOCARBAMOL 500 MG PO TABS: 500.0000 mg | ORAL_TABLET | Freq: Four times a day (QID) | ORAL | 0 refills | Status: DC | PRN

## 2021-08-23 MED ORDER — PROCHLORPERAZINE MALEATE 5 MG PO TABS: 5.0000 mg | ORAL_TABLET | Freq: Four times a day (QID) | ORAL | 0 refills | Status: DC | PRN

## 2021-08-23 MED ORDER — LIDOCAINE 5 % EX PTCH: 1.0000 | MEDICATED_PATCH | CUTANEOUS | 0 refills | Status: AC

## 2021-08-23 NOTE — Progress Notes (Signed)
Physical Therapy Treatment Patient Details Name: Melissa Mccarthy MRN: [id number] DOB: 03/29/1975 Today's Date: 08/23/2021   History of Present Illness Pt is a 46 y/o female admitted 7/24 after garage door fell and hit her in the head at work. Thought to have TBI/concussion. No pertinent PMH.    PT Comments    Pt admitted with above diagnosis. Pt positive for right posterior BPPV with PT performing Epley maneuver.  Pt felt better after treatment.   Pt was able to ambulate with RW short distance and husband is able to assist her appropriately with use of gait belt. Pt with LOB and needed assist to steady pt and husband demonstrates appropriate assist. Also educated regarding up and down steps and exercise program given as below. Pt to follow up with Outpt PT.  Pt with probable d/c today.  Pt currently with functional limitations due to balance and endurance deficits. Pt will benefit from skilled PT to increase their independence and safety with mobility to allow discharge to the venue listed below.      Recommendations for follow up therapy are one component of a multi-disciplinary discharge planning process, led by the attending physician.  Recommendations may be updated based on patient status, additional functional criteria and insurance authorization.  Follow Up Recommendations  Outpatient PT (vestibular rehab)     Assistance Recommended at Discharge Frequent or constant Supervision/Assistance  Patient can return home with the following A lot of help with walking and/or transfers;A lot of help with bathing/dressing/bathroom;Assistance with cooking/housework;Help with stairs or ramp for entrance;Assist for transportation   Equipment Recommendations  Rolling walker (2 wheels) (issued gait belt)    Recommendations for Other Services       Precautions / Restrictions Precautions Precautions: Fall Restrictions Weight Bearing Restrictions: No     Mobility  Bed Mobility Overal bed mobility:  Needs Assistance Bed Mobility: Supine to Sit   Sidelying to sit: Mod assist       General bed mobility comments: Checked vestibular system initially and pt positive for riight posterior canal BPPV therefore treated pt with canalith repositioning maneuver. Needed mod assist to come to sitting due to dizziness after positioning.    Transfers Overall transfer level: Needs assistance Equipment used: Rolling walker (2 wheels) Transfers: Sit to/from Stand Sit to Stand: Min guard           General transfer comment: cues for hand placement to reach for surfaces.   Pt tends to close eyes and needs cues to focus on surroundings to improve vertigo.    Ambulation/Gait Ambulation/Gait assistance: Min assist, Mod assist, +2 safety/equipment Gait Distance (Feet): 30 Feet Assistive device: Rolling walker (2 wheels) Gait Pattern/deviations: Decreased step length - right, Decreased step length - left, Shuffle, Knees buckling   Gait velocity interpretation: <1.31 ft/sec, indicative of household ambulator   General Gait Details: Pt needs continued steadying assist for balance with ambulation. Pt able to ambulate with cues to focus on target "A" or target in hall when dizzy. Pt with slow and guarded gait. When pt had walked about 30 feet, stated her "legs just wont go more" and pt would not progress ambulation and asked for chair with knee buckling.  Brought chair to pt and she sat down in chair to rest.  Took pt back to room.  Husband present and he did guard pt so he knows how to guard appropriately. Issued gait belt for pt for home use.   Stairs Stairs: Yes Stairs assistance: Min assist Stair Management: Step to  pattern, Sideways, One rail Left Number of Stairs: 3 General stair comments: Pt able to ascend and descend steps with assist   Wheelchair Mobility    Modified Rankin (Stroke Patients Only)       Balance Overall balance assessment: Needs assistance Sitting-balance support:  Bilateral upper extremity supported, Feet supported Sitting balance-Leahy Scale: Fair Sitting balance - Comments: Reliant on BUE support secondary to dizziness.   Standing balance support: Bilateral upper extremity supported, During functional activity, Reliant on assistive device for balance Standing balance-Leahy Scale: Poor Standing balance comment: relies on bil UE support                            Cognition Arousal/Alertness: Awake/alert Behavior During Therapy: Flat affect Overall Cognitive Status: Impaired/Different from baseline                                 General Comments: pt reports "feel better today" pt answering questions more brisk than previous sessions.        Exercises Other Exercises Other Exercises: Austin Miles exercise, self Epley and rolling exercise program given with handout    General Comments General comments (skin integrity, edema, etc.): Spouse present and educated in all aspects for pts care.  Son will be present to assist pt at home as well.      Pertinent Vitals/Pain Pain Assessment Pain Assessment: Faces Faces Pain Scale: Hurts little more Pain Location: ha Pain Descriptors / Indicators: Headache Pain Intervention(s): Limited activity within patient's tolerance, Monitored during session, Repositioned    Home Living                          Prior Function            PT Goals (current goals can now be found in the care plan section) Acute Rehab PT Goals Patient Stated Goal: to feel better Progress towards PT goals: Progressing toward goals    Frequency    Min 3X/week      PT Plan Current plan remains appropriate    Co-evaluation              AM-PAC PT "6 Clicks" Mobility   Outcome Measure  Help needed turning from your back to your side while in a flat bed without using bedrails?: A Little Help needed moving from lying on your back to sitting on the side of a flat bed without  using bedrails?: A Little Help needed moving to and from a bed to a chair (including a wheelchair)?: A Lot Help needed standing up from a chair using your arms (e.g., wheelchair or bedside chair)?: A Lot Help needed to walk in hospital room?: A Lot Help needed climbing 3-5 steps with a railing? : A Little 6 Click Score: 15    End of Session Equipment Utilized During Treatment: Gait belt Activity Tolerance: Treatment limited secondary to medical complications (Comment);Patient tolerated treatment well (dizziness) Patient left: with call bell/phone within reach;in chair;with chair alarm set;with family/visitor present Nurse Communication: Mobility status PT Visit Diagnosis: Unsteadiness on feet (R26.81);Difficulty in walking, not elsewhere classified (R26.2);Dizziness and giddiness (R42)     Time: 3016-0109 PT Time Calculation (min) (ACUTE ONLY): 56 min  Charges:  $Gait Training: 23-37 mins $Therapeutic Exercise: 8-22 mins $Canalith Rep Proc: 8-22 mins  [facility] M,PT Acute Rehab Services [phone]    Bevelyn Buckles 08/23/2021, 11:20 AM

## 2021-08-23 NOTE — TOC Transition Note (Signed)
Transition of Care [facility]) - CM/SW Discharge Note   Patient Details  Name: Melissa Mccarthy MRN: [id number] Date of Birth: 08/19/1975  [facility]) CM/SW Contact:  Lockie Pares, RN Phone Number: 08/23/2021, 12:22 PM   Clinical Narrative:      Patient worked with PT and OT. Walker recommended, walker ordered via adapt. Patient will dc after walker delivered.    Barriers to Discharge: Continued Medical Work up   Patient Goals and CMS Choice        Discharge Placement               Home        Discharge Plan and Services   Discharge Planning Services: CM Consult            DME Arranged: Dan Humphreys   Date DME Agency Contacted: 08/23/21 Time DME Agency Contacted: 1222 Representative spoke with at DME Agency: Lucretia            Social Determinants of Health (SDOH) Interventions     Readmission Risk Interventions     No data to display

## 2021-08-23 NOTE — TOC CAGE-AID Note (Signed)
Transition of Care [facility]) - CAGE-AID Screening   Patient Details  Name: Melissa Mccarthy MRN: [id number] Date of Birth: Jan 29, 1975  Transition of Care [facility]) CM/SW Contact:    Coralee Pesa, [place] Phone Number: 08/23/2021, 12:25 PM   Clinical Narrative:  CSW met with pt at bedside to complete CAGE- AID assessment. Pt denies any drug or alcohol use, no resources needed.  CAGE-AID Screening:    Have You Ever Felt You Ought to Cut Down on Your Drinking or Drug Use?: No Have People Annoyed You By Critizing Your Drinking Or Drug Use?: No Have You Felt Bad Or Guilty About Your Drinking Or Drug Use?: No Have You Ever Had a Drink or Used Drugs First Thing In The Morning to Steady Your Nerves or to Get Rid of a Hangover?: No CAGE-AID Score: 0  Substance Abuse Education Offered: No

## 2021-08-23 NOTE — Progress Notes (Signed)
Uneventful shift spent c/o headache medicated as ordered. Remains oriented *4 vital signs stable.

## 2021-08-23 NOTE — Discharge Summary (Addendum)
[facility] [place] [facility] Discharge Summary   Patient ID: Melissa Mccarthy MRN: [id number] DOB/AGE: 48-04-1975 46 y.o.  Admit date: 08/20/2021 Discharge date: 08/23/2021  Admitting Diagnosis: TBI concussion  Discharge Diagnosis Patient Active Problem List   Diagnosis Date Noted   TBI (traumatic brain injury) (HCC) 08/20/2021    Consultants Cognitive therapies   Imaging: No results found.  Procedures None  HPI: 46yo F reportedly was struck in the head by a falling garage door while at work at [employer]. Co-workers report she was altered. She was brought in by EMS as a level 1 trauma due to scene GCS 8. On arrival, VS normal. GCS E4V1M5=11. Unable to get further HX.  Hospital Course:  Thorough trauma workup including CT scan of the head, neck, chest, abdomen, and pelvis was performed and revealed no fractures or solid organ injuries. Her symptoms were consistent with significant concussion (encephalopathy due to head trauma). Patient was admitted to the hospital for observation, pain control, and physical, occupational, and cognitive therapies. Her mental status improved. She had some nausea and vomiting that was controlled with compazine. Diet was advanced as tolerated.  On 08/23/21, the patient was voiding well, tolerating diet, ambulating well, pain controlled, vital signs stable, and felt stable for discharge home with the support of her husband..  Patient will follow up as below and knows to call with questions or concerns. Outpatient neuro rehab, PT, and OT were ordered   Physical Exam: General:  Alert, NAD, pleasant, comfortable CV: RRR Pulm: nonlabored ORA Abd: soft, nontender Psych: A&Ox4 Neuro: non-focal exam, moving all extremities, sensation in tact.  Allergies as of 08/23/2021       Reactions   Acetaminophen    Other reaction(s): Unknown   Codeine    Other reaction(s): anaphylaxis        Medication List     TAKE these medications    ascorbic acid 500  MG tablet Commonly known as: VITAMIN C Take 500 mg by mouth daily.   ibuprofen 600 MG tablet Commonly known as: ADVIL Take 1 tablet (600 mg total) by mouth every 8 (eight) hours as needed for up to 7 days for mild pain, moderate pain or headache.   lidocaine 5 % Commonly known as: LIDODERM Place 1 patch onto the skin daily. Remove & Discard patch within 12 hours or as directed by MD Start taking on: August 24, 2021   methocarbamol 500 MG tablet Commonly known as: ROBAXIN Take 1 tablet (500 mg total) by mouth every 6 (six) hours as needed for muscle spasms (pain).   multivitamin with minerals Tabs tablet Take 1 tablet by mouth daily.   oxyCODONE 5 MG immediate release tablet Commonly known as: Oxy IR/ROXICODONE Take 1 tablet (5 mg total) by mouth every 8 (eight) hours as needed for severe pain (not releived by advil, robaxin, or lidoderm patch).   prochlorperazine 5 MG tablet Commonly known as: COMPAZINE Take 1 tablet (5 mg total) by mouth every 6 (six) hours as needed for nausea or vomiting.          Follow-up Information     [facility]. Call.   Specialty: Physical Medicine and Rehabilitation Why: confirm appointment date and time in the [place] concussion clinic. Contact information: [address], [place] [address] [id number] [address]c [place] [address] [phone]                Signed: Hosie Spangle, [facility] 08/23/2021, 8:49 AM

## 2021-08-23 NOTE — Plan of Care (Signed)
  Problem: Education: Goal: Knowledge of General Education information will improve Description: Including pain rating scale, medication(s)/side effects and non-pharmacologic comfort measures Outcome: Progressing   Problem: Health Behavior/Discharge Planning: Goal: Ability to manage health-related needs will improve Outcome: Progressing   Problem: Clinical Measurements: Goal: Ability to maintain clinical measurements within normal limits will improve Outcome: Progressing   Problem: Clinical Measurements: Goal: Diagnostic test results will improve Outcome: Progressing   Problem: Clinical Measurements: Goal: Respiratory complications will improve Outcome: Progressing   

## 2021-08-23 NOTE — TOC Transition Note (Signed)
Transition of Care [facility]) - CM/SW Discharge Note   Patient Details  Name: Melissa Mccarthy MRN: [id number] Date of Birth: 10-04-1975  [facility] Care [facility]) CM/SW Contact:  Glennon Mac, RN Phone Number: 08/23/2021, 10:00am   Clinical Narrative:    Patient medically stable for discharge today after working with therapies.  PT/OT and ST recommending OP follow up, and pt/husband agreeable to referrals.  Referral to OP Neuro Rehab for continued therapies.  PCP is Dr. Delila Spence.    Final next level of care: OP Rehab Barriers to Discharge: Barriers Resolved   Patient Goals and CMS Choice Patient states their goals for this hospitalization and ongoing recovery are:: to go home                            Discharge Plan and Services   Discharge Planning Services: CM Consult            DME Arranged: Dan Humphreys   Date DME Agency Contacted: 08/23/21 Time DME Agency Contacted: 1222 Representative spoke with at DME Agency: Lucretia            Social Determinants of Health (SDOH) Interventions     Readmission Risk Interventions     No data to display         Quintella Baton, RN, BSN  Trauma/Neuro ICU Case Manager [phone]

## 2021-08-23 NOTE — Progress Notes (Signed)
Pt discharged home with her husband in stable condition.

## 2021-08-28 ENCOUNTER — Encounter: Payer: Self-pay | Admitting: Physical Medicine & Rehabilitation

## 2021-09-06 ENCOUNTER — Ambulatory Visit: Payer: BC Managed Care – PPO | Admitting: Speech Pathology

## 2021-09-06 ENCOUNTER — Ambulatory Visit: Payer: BC Managed Care – PPO | Admitting: Occupational Therapy

## 2021-09-06 ENCOUNTER — Ambulatory Visit: Payer: BC Managed Care – PPO | Admitting: Physical Therapy

## 2021-09-19 ENCOUNTER — Ambulatory Visit: Payer: BC Managed Care – PPO | Admitting: Occupational Therapy

## 2021-09-25 ENCOUNTER — Encounter: Payer: BC Managed Care – PPO | Admitting: Speech Pathology

## 2021-11-28 ENCOUNTER — Ambulatory Visit: Payer: BC Managed Care – PPO | Admitting: Physical Medicine & Rehabilitation

## 2021-12-05 ENCOUNTER — Other Ambulatory Visit (HOSPITAL_BASED_OUTPATIENT_CLINIC_OR_DEPARTMENT_OTHER): Payer: Self-pay | Admitting: Neurology

## 2021-12-05 DIAGNOSIS — G44321 Chronic post-traumatic headache, intractable: Secondary | ICD-10-CM

## 2021-12-09 ENCOUNTER — Encounter (HOSPITAL_COMMUNITY): Payer: Self-pay

## 2021-12-09 ENCOUNTER — Ambulatory Visit (HOSPITAL_COMMUNITY): Payer: 59

## 2021-12-14 ENCOUNTER — Ambulatory Visit (HOSPITAL_COMMUNITY)
Admission: RE | Admit: 2021-12-14 | Discharge: 2021-12-14 | Disposition: A | Discharge status: Home / Self Care | Payer: 59 | Source: Ambulatory Visit | Attending: Neurology | Admitting: Neurology

## 2021-12-14 DIAGNOSIS — G44321 Chronic post-traumatic headache, intractable: Secondary | ICD-10-CM | POA: Insufficient documentation

## 2022-02-13 ENCOUNTER — Encounter: Payer: 59 | Attending: Physical Medicine & Rehabilitation | Admitting: Physical Medicine & Rehabilitation

## 2022-02-13 ENCOUNTER — Encounter: Payer: Self-pay | Admitting: Physical Medicine & Rehabilitation

## 2022-02-13 VITALS — BP 104/69 | HR 88 | Ht 62.0 in | Wt 119.2 lb

## 2022-02-13 DIAGNOSIS — F411 Generalized anxiety disorder: Secondary | ICD-10-CM | POA: Insufficient documentation

## 2022-02-13 DIAGNOSIS — F0781 Postconcussional syndrome: Secondary | ICD-10-CM | POA: Diagnosis not present

## 2022-02-13 DIAGNOSIS — H8110 Benign paroxysmal vertigo, unspecified ear: Secondary | ICD-10-CM | POA: Diagnosis not present

## 2022-02-13 MED ORDER — TRAZODONE HCL 50 MG PO TABS: 25.0000 mg | ORAL_TABLET | Freq: Every day | ORAL | 3 refills | Status: DC

## 2022-02-13 NOTE — Progress Notes (Signed)
Subjective:    Patient ID: Melissa Mccarthy, female    DOB: 03-08-1975, 47 y.o.   MRN: [id number]  HPI  This is an initial evaluation of Melissa Mccarthy for TBI/PCS. 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 She recalls the door falling on her, and she tried to protect herself. She recall numbness coming on in her hands and feet.  She was brought in to the ED with a GCS of 8 which improved quickly to 11. CT of head  and neck was negative for acute abnormality. In the hospital she remembers having difficulty talking, and emptying her bladder. She also remembers her legs feeling weak and numb. She was discharged home with husband on 08/23/21. She walked with a walker and cane for about 2 months. She didn't have PT until Aug/September 2023 ([facility]). She saw a neurologist Research scientist (life sciences) Neurology. Pt states that not much was done for her per pt/husband.  MRI done in November of last year demonstrated : There are a few small nonspecific foci of T2 FLAIR hyperintense signal abnormality scattered within the cerebral white matter (measuring up to 4 mm). These foci have slightly progressed in number from the prior brain MRI of 09/23/2017.  She continues to experience dizziness, persistent fatigue, insomnia, persistent headaches along the top of her head where she was hit by the door.    She used elavil for a period of time for sleep but stopped due to hangover effect and dry mouth. She has a hard time falling asleep at night when her head is flat. Vertigo seems to come on when she lies flat which tends to excite her and thus affect sleep. When her head is up she tends to more easily fall asleep because there are no vertigo symptoms. She tried melatonin which caused vivid dreams.   Pt denies depression but her husband feels that she may be a bit as she can't do the things she was accustomed to be doing before.   She reports dizziness when she  lies down flat. She closes her eyes and then the symptoms tend to go away. She may have had focused vesitbular therapy.   She will take ibuprofen or naproxen as needed for headache. She's on a nerve health supplement and ginko for energy.   Melissa Mccarthy has clearance to return to work Friday this week to a modified schedule.  It sounds as if should be working nearly at a sedentary level.  The patient is anxious to get back to work due to missing her friends and the work itself in addition to the fact that she will lose her job if she does not resume working.   Pain Inventory Average Pain 5 Pain Right Now 5 My pain is stabbing  LOCATION OF PAIN  head  BOWEL Number of stools per week: 3-4 Oral laxative use Yes  Type of laxative tea Enema or suppository use No  History of colostomy No  Incontinent No   BLADDER Pads In and out cath, frequency . Able to self cath  . Bladder incontinence No  Frequent urination No  Leakage with coughing Yes  Difficulty starting stream Yes  Incomplete bladder emptying No    Mobility walk without assistance do you drive?  yes  Function employed # of hrs/week .  Neuro/Psych bladder control problems weakness numbness tingling trouble walking dizziness confusion  Prior Studies New pt  Physicians involved in  your care New pt   No family history on file. Social History   Socioeconomic History   Marital status: Married    Spouse name: Not on file   Number of children: Not on file   Years of education: Not on file   Highest education level: Not on file  Occupational History   Not on file  Tobacco Use   Smoking status: Never   Smokeless tobacco: Never  Substance and Sexual Activity   Alcohol use: Never   Drug use: Never   Sexual activity: Not on file  Other Topics Concern   Not on file  Social History Narrative   ** Merged History Encounter **       Social Determinants of Health   Financial Resource Strain: Not on file   Food Insecurity: Not on file  Transportation Needs: Not on file  Physical Activity: Not on file  Stress: Not on file  Social Connections: Not on file   No past surgical history on file. No past medical history on file. BP 104/69   Pulse 88   Ht 5\' 2"  (1.575 m)   Wt 119 lb 3.2 oz (54.1 kg)   SpO2 98%   BMI 21.80 kg/m   Opioid Risk Score:   Fall Risk Score:  `1  Depression screen [facility] 2/9     02/13/2022   11:36 AM  Depression screen PHQ 2/9  Decreased Interest 1  Down, Depressed, Hopeless 1  PHQ - 2 Score 2  Altered sleeping 3  Tired, decreased energy 2  Change in appetite 2  Feeling bad or failure about yourself  1  Trouble concentrating 1  Moving slowly or fidgety/restless 1  Suicidal thoughts 0  PHQ-9 Score 12  Difficult doing work/chores Not difficult at all    Review of Systems  Musculoskeletal:  Positive for gait problem.  Neurological:  Positive for dizziness, weakness and numbness. Negative for tremors.  All other systems reviewed and are negative.      Objective:   Physical Exam Gen: no distress, normal appearing HEENT: oral mucosa pink and moist, NCAT Cardio: Reg rate Chest: normal effort, normal rate of breathing Abd: soft, non-distended Ext: no edema Psych: pleasant, appears anxious and at times depressed Skin: intact Neuro: Patient is alert and oriented x 3.  Demonstrates reasonable insight and awareness.  She recalled 2 out of 3 words after 5 minutes although there was a extra delay I believe and they are due to a phone call I received.  She is aware of basic current events.  She is able to sequence numbers and letters with extra time.  Motor function is 5 out of 5 and no focal sensory dysfunction is seen.  Upon cranial nerve exam her visual fields appear to be intact.  Otherwise exam is nonfocal.  Hallpike maneuver did seem to elicit some rotary nystagmus with the right word maneuver.  On the left side she closed her eyes and was difficult to see  initially what her response was.  Romberg test was equivocal to positive.  She ambulated today and tended to use of the wall for balance. Musculoskeletal: Full ROM, No pain with AROM or PROM in the neck, trunk, or extremities. Posture appropriate         Assessment & Plan:  Postconcussion syndrome with residual headaches, insomnia/fatigue, anxiety, BPPV    Plan: Made a referral to [facility] neuro rehab for vestibular and balance assessment.  Her vertigo is actually even affecting her sleep at night.  Will begin a trial of trazodone 25 mg nightly for sleep and anxiety.  She can try this for a few days and if she sees no benefit she can increase to 50 mg. Consider anxiolytic medication and perhaps some counseling Consider trial of Topamax for headaches.  I first want to see how she does if we are able to reestablish a more appropriate sleep-wake cycle I think it might be helpful for her to return to work but only if it is at a sedentary level.  It would benefit her to have a week to see how she does with treatments and the medication listed above.  I extended her return to work to 02/22/2022  Over an hour was spent with the patient in examination and discussion of the treatment plan.  All questions were encouraged and answered.  I will see her back in about 6 weeks time.

## 2022-02-13 NOTE — Patient Instructions (Addendum)
ALWAYS FEEL FREE TO CALL OUR OFFICE WITH ANY PROBLEMS OR QUESTIONS ([phone])  **PLEASE NOTE** ALL MEDICATION REFILL REQUESTS (INCLUDING CONTROLLED SUBSTANCES) NEED TO BE MADE AT LEAST 7 DAYS PRIOR TO REFILL BEING DUE. ANY REFILL REQUESTS INSIDE THAT TIME FRAME MAY RESULT IN DELAYS IN RECEIVING YOUR PRESCRIPTION.    TAKE THE TRAZODONE 30 MINUTES OR SO PRIOR TO GOING TO BED.  IF THE 25MG  (1/2 TAB) DOESN'T HELP YOU FALL ASLEEP, INCREASE TO FULL TABLET AFTER 4 DAYS.

## 2022-02-18 ENCOUNTER — Ambulatory Visit: Payer: 59 | Attending: Family Medicine

## 2022-02-18 ENCOUNTER — Telehealth: Payer: Self-pay

## 2022-02-18 DIAGNOSIS — R42 Dizziness and giddiness: Secondary | ICD-10-CM | POA: Insufficient documentation

## 2022-02-18 DIAGNOSIS — R2681 Unsteadiness on feet: Secondary | ICD-10-CM | POA: Insufficient documentation

## 2022-02-18 DIAGNOSIS — F0781 Postconcussional syndrome: Secondary | ICD-10-CM | POA: Diagnosis not present

## 2022-02-18 DIAGNOSIS — R278 Other lack of coordination: Secondary | ICD-10-CM | POA: Insufficient documentation

## 2022-02-18 DIAGNOSIS — R262 Difficulty in walking, not elsewhere classified: Secondary | ICD-10-CM | POA: Diagnosis present

## 2022-02-18 DIAGNOSIS — H8110 Benign paroxysmal vertigo, unspecified ear: Secondary | ICD-10-CM | POA: Insufficient documentation

## 2022-02-18 NOTE — Telephone Encounter (Signed)
Patient had question about her sleeping cycle, patient said her PCP cannot do anything with the sleeping cycle but they said they can help with the dizziness and did not want to refer her to a specialist and wants to know what you recommend.
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
## 2022-02-19 MED ORDER — TRAZODONE HCL 50 MG PO TABS: 50.0000 mg | ORAL_TABLET | Freq: Every day | ORAL | 4 refills | Status: DC

## 2022-02-19 NOTE — Telephone Encounter (Signed)
I spoke to pt. She's doing well with a full trazodone.  A new rx was sent to pharmacy to reflect that she's taking a full tablet.  She just started PT for balance/vertigo

## 2022-02-20 ENCOUNTER — Ambulatory Visit: Payer: 59

## 2022-02-20 DIAGNOSIS — R42 Dizziness and giddiness: Secondary | ICD-10-CM | POA: Diagnosis not present

## 2022-02-20 DIAGNOSIS — R278 Other lack of coordination: Secondary | ICD-10-CM

## 2022-02-20 DIAGNOSIS — R262 Difficulty in walking, not elsewhere classified: Secondary | ICD-10-CM

## 2022-02-20 DIAGNOSIS — R2681 Unsteadiness on feet: Secondary | ICD-10-CM

## 2022-02-20 NOTE — Therapy (Unsigned)
OUTPATIENT PHYSICAL THERAPY VESTIBULAR TREATMENT     Patient Name: Melissa Mccarthy MRN: [id number] DOB:08-06-1975, 47 y.o., female Today's Date: 02/20/2022  END OF SESSION:  PT End of Session - 02/20/22 1533     Visit Number 2    Number of Visits 17    Date for PT Re-Evaluation 04/29/22    Authorization Type [employer] other    PT Start Time 1532    PT Stop Time 1615    PT Time Calculation (min) 43 min    Activity Tolerance Patient tolerated treatment well    Behavior During Therapy [facility] for tasks assessed/performed             History reviewed. No pertinent past medical history. History reviewed. No pertinent surgical history. Patient Active Problem List   Diagnosis Date Noted   Post concussion syndrome 02/13/2022   BPPV (benign paroxysmal positional vertigo), unspecified laterality 02/13/2022   Generalized anxiety disorder 02/13/2022   TBI (traumatic brain injury) ([facility]) 08/20/2021    PCP: Horton Marshall, PA REFERRING PROVIDER: Ranelle Oyster, MD  REFERRING DIAG: F07.81 (ICD-10-CM) - Post concussion syndrome H81.10 (ICD-10-CM) - Benign paroxysmal positional vertigo, unspecified laterality  THERAPY DIAG:  Dizziness and giddiness  Unsteadiness on feet  Difficulty in walking, not elsewhere classified  Other lack of coordination  ONSET DATE: 02/13/2022 referral  Rationale for Evaluation and Treatment: Rehabilitation  SUBJECTIVE:   SUBJECTIVE STATEMENT: Patient reports doing well. Had a good nights sleep increasing her trazadone. Napped today at noon and laid down very quickly and had some dizziness lasting ~2 mins. Denies falls/near falls.  Pt accompanied by: self  PERTINENT HISTORY: previous concussion 2023   PAIN:  Are you having pain? No  PRECAUTIONS: Fall   PATIENT GOALS: "I want to get back to my sleep cycle"   PATIENT SURVEYS:  DHI 78 FOTO 46  VESTIBULAR ASSESSMENT: -patient noted to have increased eye "twitching," but not necessarily nystagmus  with rolling on mat L >R -upon sitting up, patient reporting significant increase in HA point tenderness to L crown of head    POSITIONAL TESTING: Right Dix-Hallpike: no nystagmus and reports feeling "heaviness" in corner of R eye  MOTION SENSITIVITY:  Motion Sensitivity Quotient Intensity: 0 = none, 1 = Lightheaded, 2 = Mild, 3 = Moderate, 4 = Severe, 5 = Vomiting  Intensity  1. Sitting to supine 2  2. Supine to L side 3 significant increase in HA  3. Supine to R side 2  4. Supine to sitting 3  5. L Hallpike-Dix 4  6. Up from L  4  7. R Hallpike-Dix 3  8. Up from R  3  9. Sitting, head tipped to L knee "Heavy" feeling in head  10. Head up from L knee 2  11. Sitting, head tipped to R knee "Heavy" feeling in head  12. Head up from R knee 2  13. Sitting head turns x5 3  14.Sitting head nods x5 3  15. In stance, 180 turn to L  1  16. In stance, 180 turn to R 1    DVA:  Static- line 10; dynamic- line 5 with increased report of dizziness  PATIENT EDUCATION: Education details: PT POC, exam findings Person educated: Patient Education method: Explanation Education comprehension: verbalized understanding and needs further education  HOME EXERCISE PROGRAM: Rolling    With pillow under head, start on back. Roll slowly to right. Hold position until symptoms subside. Roll slowly onto left side. Hold position until symptoms subside. Repeat  sequence __8__ times per session. Do ___3_ sessions per day.  Sit to Side-Lying    Sit on edge of bed. 1. Turn head 45 to right. 2. Maintain head position and lie down slowly on left side. Hold until symptoms subside. 3. Sit up slowly. Hold until symptoms subside. 4. Turn head 45 to left. 5. Maintain head position and lie down slowly on right side. Hold until symptoms subside. 6. Sit up slowly. Repeat sequence __6__ times per session. Do __3__ sessions per day.   GOALS: Goals reviewed with patient? Yes  SHORT TERM GOALS: Target date:  03/18/22  Pt will be independent with initial HEP for improved symptom report  Baseline: to be assessed Goal status: INITIAL  2.  Pt will improve DVA to </= 3 line difference to indicate improved VOR  Baseline: 5 lines Goal status: INITIAL  3.  Pt will report </= 2/5 for all movements on MSQ to indicate improvement in motion sensitivity and improved activity tolerance.   Baseline: 4/5 Goal status: INITIAL  4.  Patient will improve her DHI score to </= 54 to demonstrate improvement in symptom report Baseline: 78 Goal status: INITIAL  5.  M-CTSIB Baseline: to be assessed Goal status: INITIAL  6. Patient will demonstrate a convergence of </= 8cm to demonstrate improved pairing of visual tracking   Baseline: ~15cm  Goal status: INITIAL  LONG TERM GOALS: Target date: 04/15/22  Pt will be independent with final HEP for improved symptom report  Baseline: to be provided  Goal status: INITIAL  2.  Pt will improve DVA to </= 2 line difference to indicate improved VOR  Baseline: 5 lines Goal status: INITIAL  3.  Pt will report </= 1/5 for all movements on MSQ to indicate improvement in motion sensitivity and improved activity tolerance.   Baseline: 4/5 Goal status: INITIAL  4.  DHI goal Baseline: to be assessed Goal status: INITIAL  5.  M-CTSIB goal  Baseline: to be assessed Goal status: INITIAL  6.  Patient will demonstrate a convergence of </= 5cm to demonstrate normal pairing of visual tracking  Baseline: ~15cm  Goal status: INITIAL   7. Patient will improve FOTO score to >/= 56 to demonstrate improved symptom report   Baseline: 46   Goal status: NEW ASSESSMENT:  CLINICAL IMPRESSION: Patient seen for skilled PT session with emphasis on completing vestibular assessment and initiating basic HEP. She continues to have significant motion sensitivity, primarily with movements to the L anda reported increase in L HA. Patient with vision symptoms including "twitching" in  B eyes, not consistent with nystagmus, and reports increased "heaviness" in her eyes. Her DHI score indicates a severe handicap due to her dizziness. Continue POC.    OBJECTIVE IMPAIRMENTS: Abnormal gait, decreased balance, decreased knowledge of condition, difficulty walking, dizziness, and impaired vision/preception.   ACTIVITY LIMITATIONS: lifting, bending, sleeping, bed mobility, bathing, reach over head, locomotion level, and caring for others  PARTICIPATION LIMITATIONS: meal prep, cleaning, interpersonal relationship, driving, shopping, community activity, occupation, and yard work  PERSONAL FACTORS: Past/current experiences, Sex, Time since onset of injury/illness/exacerbation, Transportation, and 1 comorbidity: previous TBI July 2023  are also affecting patient's functional outcome.   REHAB POTENTIAL: Fair time since onset  CLINICAL DECISION MAKING: Evolving/moderate complexity  EVALUATION COMPLEXITY: Moderate   PLAN:  PT FREQUENCY: 2x/week  PT DURATION: 8 weeks  PLANNED INTERVENTIONS: Therapeutic exercises, Therapeutic activity, Neuromuscular re-education, Balance training, Gait training, Patient/Family education, Self Care, Joint mobilization, Stair training, Vestibular training, Canalith repositioning, Visual/preceptual  remediation/compensation, Orthotic/Fit training, DME instructions, Aquatic Therapy, Cognitive remediation, Manual therapy, and Re-evaluation  PLAN FOR NEXT SESSION:  M-CTSIB, brock string, habituation   Debbora Dus, PT Debbora Dus, PT, DPT, CBIS  02/20/2022, 4:16 PM

## 2022-02-27 ENCOUNTER — Ambulatory Visit: Payer: 59

## 2022-02-27 DIAGNOSIS — R262 Difficulty in walking, not elsewhere classified: Secondary | ICD-10-CM

## 2022-02-27 DIAGNOSIS — R42 Dizziness and giddiness: Secondary | ICD-10-CM | POA: Diagnosis not present

## 2022-02-27 DIAGNOSIS — R2681 Unsteadiness on feet: Secondary | ICD-10-CM

## 2022-02-27 DIAGNOSIS — R278 Other lack of coordination: Secondary | ICD-10-CM

## 2022-02-27 NOTE — Therapy (Signed)
OUTPATIENT PHYSICAL THERAPY VESTIBULAR TREATMENT     Patient Name: Merced Brougham MRN: [id number] DOB:11-07-1975, 47 y.o., female Today's Date: 02/27/2022  END OF SESSION:  PT End of Session - 02/27/22 1536     Visit Number 3    Number of Visits 17    Date for PT Re-Evaluation 04/29/22    Authorization Type [employer] other    PT Start Time 4782   patient late   PT Stop Time 1620    PT Time Calculation (min) 46 min    Activity Tolerance Patient tolerated treatment well    Behavior During Therapy [facility] for tasks assessed/performed             History reviewed. No pertinent past medical history. History reviewed. No pertinent surgical history. Patient Active Problem List   Diagnosis Date Noted   Post concussion syndrome 02/13/2022   BPPV (benign paroxysmal positional vertigo), unspecified laterality 02/13/2022   Generalized anxiety disorder 02/13/2022   TBI (traumatic brain injury) ([place]) 08/20/2021    PCP: Marilynne Drivers, PA REFERRING PROVIDER: Meredith Staggers, MD  REFERRING DIAG: F07.81 (ICD-10-CM) - Post concussion syndrome H81.10 (ICD-10-CM) - Benign paroxysmal positional vertigo, unspecified laterality  THERAPY DIAG:  Dizziness and giddiness  Unsteadiness on feet  Difficulty in walking, not elsewhere classified  Other lack of coordination  ONSET DATE: 02/13/2022 referral  Rationale for Evaluation and Treatment: Rehabilitation  SUBJECTIVE:   SUBJECTIVE STATEMENT: Patient reports not doing well. Was told that she was terminated from her job because she can't return to work, despite it being a work-related injury. Patient tearful. Does report that HEP is helping overall with her symptoms.  Pt accompanied by: self  PERTINENT HISTORY: previous concussion 2023   PAIN:  Are you having pain? No  PRECAUTIONS: Fall   PATIENT GOALS: "I want to get back to my sleep cycle"   TODAYS TREATMENT:  -Brandt daroff R/L x3   -initially reporting 1/5, fading to 0/5  dizziness, did report some pressure on the L side of her head -B rolling R/L  -denies dizziness, but does report some pressure on L side of head - VOR x1 horizontal/vertical x45s (slow speed)  -VOR cancellation horizontal x45s (slow speed)  -provided robust HEP in case patient can't return due to insurance   PATIENT EDUCATION: Education details: HEP Person educated: Patient Education method: Explanation Education comprehension: verbalized understanding and needs further education  HOME EXERCISE PROGRAM: Rolling    With pillow under head, start on back. Roll slowly to right. Hold position until symptoms subside. Roll slowly onto left side. Hold position until symptoms subside. Repeat sequence __8__ times per session. Do ___3_ sessions per day.  Sit to Side-Lying    Sit on edge of bed. 1. Turn head 45 to right. 2. Maintain head position and lie down slowly on left side. Hold until symptoms subside. 3. Sit up slowly. Hold until symptoms subside. 4. Turn head 45 to left. 5. Maintain head position and lie down slowly on right side. Hold until symptoms subside. 6. Sit up slowly. Repeat sequence __6__ times per session. Do __3__ sessions per day.  Gaze Stabilization: Sitting    Keeping eyes on target on wall 5 feet away, tilt head down 15-30 and move head side to side for __30__ seconds. Repeat while moving head up and down for _30___ seconds. Do __3__ sessions per day. Repeat using target on pattern background when plain background is easy -BROCK STRING Head Motion: Side to Side    Sitting, tilt  head down 15-30, slowly move head to right with eyes open. Hold position until symptoms subside. Then, move head slowly to opposite side. Hold position until symptoms subside. Repeat ___20_ times per session. Do __3__ sessions per day. Head Motion: Up and Down    Sitting, slowly move head up with eyes open. Hold position until symptoms subside. Then, move head in opposite  direction. Hold position until symptoms subside. Repeat __20__ times per session. Do _3___ sessions per day. Oculomotor: Saccades    Holding two targets positioned side by side __6__ inches apart, move eyes quickly from target to target as head stays still. Move __10__ seconds each direction. Perform sitting. Repeat ___3_ times per session. Do __3__ sessions per day. Oculomotor: Smooth Pursuits    Holding a target, keep eyes on target and slowly move target side to side with head still. Perform sitting. Repeat __30__ times per session. Do __3__ sessions per day. Repeat using target on pattern background when plain background is easy.  Bending / Picking Up Objects    Sitting, slowly bend head down and pick up object on the floor. Return to upright position. Hold position until symptoms subside. Repeat __8__ times per session. Do __3__ sessions per day.  Access Code: [email or web address] URL: [email or web address] Date: 02/27/2022 Prepared by: Estevan Ryder  Exercises - Standing Balance in Corner with Eyes Closed  - 1 x daily - 7 x weekly - 3 sets - 10 reps - Standing Balance in Corner  - 1 x daily - 7 x weekly - 3 sets - 10 reps - Corner Balance Feet Together With Eyes Open  - 1 x daily - 7 x weekly - 3 sets - 10 reps - Corner Balance Feet Together With Eyes Closed  - 1 x daily - 7 x weekly - 3 sets - 10 reps - Corner Balance Feet Together: Eyes Closed With Head Turns  - 1 x daily - 7 x weekly - 3 sets - 10 reps - Semi-Tandem Corner Balance: Eyes Open With Head Turns  - 1 x daily - 7 x weekly - 3 sets - 10 reps - Semi-Tandem Corner Balance With Eyes Closed  - 1 x daily - 7 x weekly - 3 sets - 10 reps - Semi-Tandem Corner Balance: Eyes Closed With Head Turns  - 1 x daily - 7 x weekly - 3 sets - 10 reps - Corner Balance Feet Apart: Eyes Open With Head Turns  - 1 x daily - 7 x weekly - 3 sets - 10 reps - Seated VOR Cancellation  - 1 x daily - 7 x weekly - 3 sets - 30  hold GOALS: Goals reviewed with patient? Yes  SHORT TERM GOALS: Target date: 03/18/22  Pt will be independent with initial HEP for improved symptom report  Baseline: to be assessed Goal status: INITIAL  2.  Pt will improve DVA to </= 3 line difference to indicate improved VOR  Baseline: 5 lines Goal status: INITIAL  3.  Pt will report </= 2/5 for all movements on MSQ to indicate improvement in motion sensitivity and improved activity tolerance.   Baseline: 4/5 Goal status: INITIAL  4.  Patient will improve her DHI score to </= 54 to demonstrate improvement in symptom report Baseline: 78 Goal status: INITIAL  5.  M-CTSIB Baseline: to be assessed Goal status: INITIAL  6. Patient will demonstrate a convergence of </= 8cm to demonstrate improved pairing of visual tracking   Baseline: ~15cm  Goal status: INITIAL  LONG  TERM GOALS: Target date: 04/15/22  Pt will be independent with final HEP for improved symptom report  Baseline: to be provided  Goal status: INITIAL  2.  Pt will improve DVA to </= 2 line difference to indicate improved VOR  Baseline: 5 lines Goal status: INITIAL  3.  Pt will report </= 1/5 for all movements on MSQ to indicate improvement in motion sensitivity and improved activity tolerance.   Baseline: 4/5 Goal status: INITIAL  4.  DHI goal Baseline: to be assessed Goal status: INITIAL  5.  M-CTSIB goal  Baseline: to be assessed Goal status: INITIAL  6.  Patient will demonstrate a convergence of </= 5cm to demonstrate normal pairing of visual tracking  Baseline: ~15cm  Goal status: INITIAL   7. Patient will improve FOTO score to >/= 56 to demonstrate improved symptom report   Baseline: 46   Goal status: NEW ASSESSMENT:  CLINICAL IMPRESSION: Patient seen for skilled PT session with emphasis on vestibular retraining. Patient tolerating HEP well and exercises during session with significant improvement thus far. Due to insurance limitations, PT  providing patient with robust HEP, appropriate progressions and relative timeline for progressions. Continue POC as able.    OBJECTIVE IMPAIRMENTS: Abnormal gait, decreased balance, decreased knowledge of condition, difficulty walking, dizziness, and impaired vision/preception.   ACTIVITY LIMITATIONS: lifting, bending, sleeping, bed mobility, bathing, reach over head, locomotion level, and caring for others  PARTICIPATION LIMITATIONS: meal prep, cleaning, interpersonal relationship, driving, shopping, community activity, occupation, and yard work  PERSONAL FACTORS: Past/current experiences, Sex, Time since onset of injury/illness/exacerbation, Transportation, and 1 comorbidity: previous TBI July 2023  are also affecting patient's functional outcome.   REHAB POTENTIAL: Fair time since onset  CLINICAL DECISION MAKING: Evolving/moderate complexity  EVALUATION COMPLEXITY: Moderate   PLAN:  PT FREQUENCY: 2x/week  PT DURATION: 8 weeks  PLANNED INTERVENTIONS: Therapeutic exercises, Therapeutic activity, Neuromuscular re-education, Balance training, Gait training, Patient/Family education, Self Care, Joint mobilization, Stair training, Vestibular training, Canalith repositioning, Visual/preceptual remediation/compensation, Orthotic/Fit training, DME instructions, Aquatic Therapy, Cognitive remediation, Manual therapy, and Re-evaluation  PLAN FOR NEXT SESSION:  M-CTSIB, brock string, habituation   Debbora Dus, PT Debbora Dus, PT, DPT, CBIS  02/27/2022, 4:26 PM

## 2022-02-28 NOTE — Telephone Encounter (Signed)
error 

## 2022-03-01 ENCOUNTER — Ambulatory Visit: Payer: 59

## 2022-03-04 ENCOUNTER — Ambulatory Visit: Payer: 59

## 2022-03-06 ENCOUNTER — Ambulatory Visit: Payer: 59

## 2022-03-11 ENCOUNTER — Ambulatory Visit: Payer: 59

## 2022-03-27 ENCOUNTER — Other Ambulatory Visit: Payer: Self-pay | Admitting: *Deleted

## 2022-03-27 NOTE — Telephone Encounter (Signed)
Dr. Naaman Plummer patient requesting a refill on Naproxen. Explained to patient this is an otc medication but she wants rx. Also notified Dr. Naaman Plummer out of office today and that I would send to another provider. Please advise.

## 2022-03-28 ENCOUNTER — Telehealth: Payer: Self-pay | Admitting: *Deleted

## 2022-03-28 NOTE — Telephone Encounter (Signed)
Melissa Mccarthy called again about the request for naproxen 375 mg. She would like for Dr Naaman Plummer to order it for her when he returns. Ibuprofen does not work for her. Waking up during the night even with the Trazodone with a headache. We suggested aleve but she wants Dr Naaman Plummer to order for her.

## 2022-03-28 NOTE — Telephone Encounter (Signed)
Call placed to [facility] regarding the Naproxen. Dr Naaman Plummer note was reviewed.  Dr Naaman Plummer never ordered the Naproxen.  Sybil RN spoke with Ms. Sabic, regarding the above.

## 2022-04-01 MED ORDER — NAPROXEN 375 MG PO TABS: 375.0000 mg | ORAL_TABLET | Freq: Two times a day (BID) | ORAL | 3 refills | Status: AC | PRN

## 2022-04-01 NOTE — Telephone Encounter (Signed)
Ibuprofen stopped. New rx for naproxen sent to pharmacy

## 2022-04-03 ENCOUNTER — Encounter: Payer: Self-pay | Admitting: Physical Medicine & Rehabilitation

## 2022-04-03 ENCOUNTER — Encounter
Payer: BC Managed Care – PPO | Attending: Physical Medicine & Rehabilitation | Admitting: Physical Medicine & Rehabilitation

## 2022-04-03 VITALS — BP 109/68 | HR 95 | Ht 62.0 in | Wt 115.6 lb

## 2022-04-03 DIAGNOSIS — F0781 Postconcussional syndrome: Secondary | ICD-10-CM | POA: Diagnosis not present

## 2022-04-03 DIAGNOSIS — H8110 Benign paroxysmal vertigo, unspecified ear: Secondary | ICD-10-CM | POA: Diagnosis not present

## 2022-04-03 NOTE — Progress Notes (Signed)
Subjective:    Patient ID: Melissa Mccarthy, female    DOB: 1975-07-22, 47 y.o.   MRN: [id number]  HPI  Melissa Mccarthy is here in follow up of her PCS. She is now sleeping better with trazodone '50mg'$  qhs. She takes it an hour before she wants to sleep. With the trazodone she avoids the startling/spinning sensations she experienced before when she lays down to go to bed.  She was placed on the "inactive" list at work an cannot return to that position apparently. 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 She is unsure when she'll be ready to return to work. She has been trying to be more active, walking, etc.   She went for a few sessions of vestibular therapy. It was cut off d/t losing her insurance. She is doing exercises at home 2-3 x daily. The vertigo episodes have really decreased. They only happen when she stands up too quickly and may last only 1-2 minutes.   Her mood has improved dramatically with better sleep. Her anxiety is minimal at this point.   Pain Inventory Average Pain 8 Pain Right Now 0 My pain is intermittent and stabbing  LOCATION OF PAIN  head  BOWEL Number of stools per week: 7 Oral laxative use No  Type of laxative . Enema or suppository use No  History of colostomy No  Incontinent No   BLADDER Normal In and out cath, frequency . Able to self cath  . Bladder incontinence No  Frequent urination No  Leakage with coughing No  Difficulty starting stream No  Incomplete bladder emptying No    Mobility ability to climb steps?  no do you drive?  yes  Function not employed: date last employed 08/20/21  Neuro/Psych No problems in this area  Prior Studies Any changes since last visit?  no  Physicians involved in your care Any changes since last visit?  no   No family history on file. Social History   Socioeconomic History   Marital status: Married    Spouse name: Not on file   Number of children: Not on file   Years of  education: Not on file   Highest education level: Not on file  Occupational History   Not on file  Tobacco Use   Smoking status: Never   Smokeless tobacco: Never  Substance and Sexual Activity   Alcohol use: Never   Drug use: Never   Sexual activity: Not on file  Other Topics Concern   Not on file  Social History Narrative   ** Merged History Encounter **       Social Determinants of Health   Financial Resource Strain: Not on file  Food Insecurity: Not on file  Transportation Needs: Not on file  Physical Activity: Not on file  Stress: Not on file  Social Connections: Not on file   No past surgical history on file. No past medical history on file. BP 109/68   Pulse 95   Ht '5\' 2"'$  (1.575 m)   Wt 115 lb 9.6 oz (52.4 kg)   SpO2 99%   BMI 21.14 kg/m   Opioid Risk Score:   Fall Risk Score:  `1  Depression screen [facility] 2/9     02/13/2022   11:36 AM  Depression screen PHQ 2/9  Decreased Interest 1  Down, Depressed, Hopeless 1  PHQ - 2 Score 2  Altered sleeping 3  Tired, decreased energy 2  Change in appetite 2  Feeling bad or failure about yourself  1  Trouble concentrating 1  Moving slowly or fidgety/restless 1  Suicidal thoughts 0  PHQ-9 Score 12  Difficult doing work/chores Not difficult at all     Review of Systems  Neurological:  Positive for headaches.  All other systems reviewed and are negative.      Objective:   Physical Exam  General: No acute distress HEENT: NCAT, EOMI, oral membranes moist Cards: reg rate  Chest: normal effort Abdomen: Soft, NT, ND Skin: dry, intact Extremities: no edema Psych: pleasant and appropriate  Skin: intact Neuro: Alert and oriented x 3. Normal insight and awareness. Intact Memory. Normal language and speech. Cranial nerve exam unremarkable. nOrmal balance, motor 5/5. No vestibular deficits appreciated today.  Musculoskeletal: Full ROM, No pain with AROM or PROM in the neck, trunk, or extremities. Posture  appropriate             Assessment & Plan:  Postconcussion syndrome with residual headaches, insomnia/fatigue, anxiety, BPPV       Plan: Continue with habituation exercises at home. She may look into [facility] in April.  Continue trazodone '50mg'$  qhs for sleep, anxiety. This has made a huge difference for her.. Naproxen for headaches, '375mg'$  bid prn Advised a period of "work conditioning" at home this month. Can return to work on 04/29/22 at light physical activity level.    Twenty minutes of face to face patient care time were spent during this visit. All questions were encouraged and answered.  Follow up with me in  4 mos .

## 2022-04-03 NOTE — Patient Instructions (Addendum)
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 Very Active -- You work a consistently physically demanding job, such as an Arts administrator, or you work a Network engineer job and engage in intense exercise for 1 hour per day or moderate exercise for 2 hours per day, 5-7 times per week. Extra Active -- You work an extremely physically demanding job, such as a Interior and spatial designer, competitive cyclist, or fitness professional, or you engage in intense exercise for at least 2 hours per day.    RETURN TO WORK April 29, 2022 AT LIGHT PHYSICAL ACTIVITY  PLEASE WORK ON CONDITIONING YOURSELF FOR INCREASED ACTIVITY OVER THE NEXT 3+ WEEKS.

## 2022-05-03 ENCOUNTER — Telehealth: Payer: Self-pay | Admitting: Physical Medicine & Rehabilitation

## 2022-05-03 DIAGNOSIS — H8110 Benign paroxysmal vertigo, unspecified ear: Secondary | ICD-10-CM

## 2022-05-03 NOTE — Telephone Encounter (Signed)
Spoke to pt. Recommend fluids, antivert, gaze exercises, and made referral back to neurorehab for vestibular treatment which she never completed.

## 2022-05-03 NOTE — Telephone Encounter (Signed)
Patient having dizzy spells and would like to know if there is a medication or something else she can take or help her with this.  Please call her at [phone].

## 2022-05-31 DIAGNOSIS — Z13220 Encounter for screening for lipoid disorders: Secondary | ICD-10-CM | POA: Diagnosis not present

## 2022-05-31 DIAGNOSIS — Z0000 Encounter for general adult medical examination without abnormal findings: Secondary | ICD-10-CM | POA: Diagnosis not present

## 2022-05-31 DIAGNOSIS — D509 Iron deficiency anemia, unspecified: Secondary | ICD-10-CM | POA: Diagnosis not present

## 2022-06-07 DIAGNOSIS — Z1231 Encounter for screening mammogram for malignant neoplasm of breast: Secondary | ICD-10-CM | POA: Diagnosis not present

## 2022-07-19 ENCOUNTER — Other Ambulatory Visit: Payer: Self-pay | Admitting: Physical Medicine & Rehabilitation

## 2022-07-30 DIAGNOSIS — Z1211 Encounter for screening for malignant neoplasm of colon: Secondary | ICD-10-CM | POA: Diagnosis not present

## 2022-07-30 DIAGNOSIS — K5730 Diverticulosis of large intestine without perforation or abscess without bleeding: Secondary | ICD-10-CM | POA: Diagnosis not present

## 2022-07-30 DIAGNOSIS — D120 Benign neoplasm of cecum: Secondary | ICD-10-CM | POA: Diagnosis not present

## 2022-08-07 ENCOUNTER — Encounter: Payer: BC Managed Care – PPO | Admitting: Physical Medicine & Rehabilitation

## 2022-09-11 ENCOUNTER — Encounter: Payer: BC Managed Care – PPO | Admitting: Physical Medicine & Rehabilitation

## 2023-03-17 DIAGNOSIS — Z13220 Encounter for screening for lipoid disorders: Secondary | ICD-10-CM | POA: Diagnosis not present

## 2023-03-17 DIAGNOSIS — Z0000 Encounter for general adult medical examination without abnormal findings: Secondary | ICD-10-CM | POA: Diagnosis not present

## 2023-03-17 DIAGNOSIS — Z131 Encounter for screening for diabetes mellitus: Secondary | ICD-10-CM | POA: Diagnosis not present

## 2023-03-17 DIAGNOSIS — N939 Abnormal uterine and vaginal bleeding, unspecified: Secondary | ICD-10-CM | POA: Diagnosis not present

## 2023-06-13 DIAGNOSIS — Z1231 Encounter for screening mammogram for malignant neoplasm of breast: Secondary | ICD-10-CM | POA: Diagnosis not present

## 2023-09-03 DIAGNOSIS — E782 Mixed hyperlipidemia: Secondary | ICD-10-CM | POA: Diagnosis not present

## 2023-09-03 DIAGNOSIS — R7303 Prediabetes: Secondary | ICD-10-CM | POA: Diagnosis not present
